# Patient Record
Sex: Female | Born: 1970 | Race: White | Hispanic: No | Marital: Married | Smoking: Former smoker
Health system: Southern US, Community
[De-identification: ages and names within clinical notes are randomized; demographics above are authoritative.]

## PROBLEM LIST (undated history)

## (undated) DIAGNOSIS — M79606 Pain in leg, unspecified: Secondary | ICD-10-CM

## (undated) DIAGNOSIS — M5430 Sciatica, unspecified side: Secondary | ICD-10-CM

## (undated) DIAGNOSIS — I10 Essential (primary) hypertension: Secondary | ICD-10-CM

## (undated) DIAGNOSIS — M545 Low back pain, unspecified: Secondary | ICD-10-CM

## (undated) DIAGNOSIS — B1920 Unspecified viral hepatitis C without hepatic coma: Secondary | ICD-10-CM

## (undated) DIAGNOSIS — G5600 Carpal tunnel syndrome, unspecified upper limb: Secondary | ICD-10-CM

## (undated) DIAGNOSIS — G8929 Other chronic pain: Secondary | ICD-10-CM

## (undated) DIAGNOSIS — N841 Polyp of cervix uteri: Secondary | ICD-10-CM

## (undated) HISTORY — PX: BREAST ENHANCEMENT SURGERY: SHX7

## (undated) HISTORY — PX: TUBAL LIGATION: SHX77

## (undated) HISTORY — PX: HERNIA REPAIR: SHX51

## (undated) HISTORY — DX: Polyp of cervix uteri: N84.1

## (undated) HISTORY — PX: CARPAL TUNNEL RELEASE: SHX101

## (undated) HISTORY — DX: Unspecified viral hepatitis C without hepatic coma: B19.20

## (undated) HISTORY — DX: Carpal tunnel syndrome, unspecified upper limb: G56.00

## (undated) HISTORY — DX: Sciatica, unspecified side: M54.30

---

## 2009-03-15 ENCOUNTER — Emergency Department (HOSPITAL_BASED_OUTPATIENT_CLINIC_OR_DEPARTMENT_OTHER): Admission: EM | Admit: 2009-03-15 | Discharge: 2009-03-15 | Payer: Self-pay | Admitting: Emergency Medicine

## 2009-03-15 ENCOUNTER — Ambulatory Visit: Payer: Self-pay | Admitting: Diagnostic Radiology

## 2009-05-02 ENCOUNTER — Ambulatory Visit: Payer: Self-pay | Admitting: Diagnostic Radiology

## 2009-05-02 ENCOUNTER — Emergency Department (HOSPITAL_BASED_OUTPATIENT_CLINIC_OR_DEPARTMENT_OTHER): Admission: EM | Admit: 2009-05-02 | Discharge: 2009-05-02 | Payer: Self-pay | Admitting: Emergency Medicine

## 2009-07-09 ENCOUNTER — Emergency Department (HOSPITAL_BASED_OUTPATIENT_CLINIC_OR_DEPARTMENT_OTHER): Admission: EM | Admit: 2009-07-09 | Discharge: 2009-07-09 | Payer: Self-pay | Admitting: Emergency Medicine

## 2009-12-12 ENCOUNTER — Emergency Department (HOSPITAL_BASED_OUTPATIENT_CLINIC_OR_DEPARTMENT_OTHER): Admission: EM | Admit: 2009-12-12 | Discharge: 2009-12-12 | Payer: Self-pay | Admitting: Emergency Medicine

## 2009-12-12 ENCOUNTER — Ambulatory Visit: Payer: Self-pay | Admitting: Interventional Radiology

## 2010-02-10 ENCOUNTER — Emergency Department (HOSPITAL_BASED_OUTPATIENT_CLINIC_OR_DEPARTMENT_OTHER)
Admission: EM | Admit: 2010-02-10 | Discharge: 2010-02-10 | Payer: Self-pay | Source: Home / Self Care | Admitting: Emergency Medicine

## 2010-02-17 LAB — URINALYSIS, ROUTINE W REFLEX MICROSCOPIC
Bilirubin Urine: NEGATIVE
Hgb urine dipstick: NEGATIVE
Ketones, ur: NEGATIVE mg/dL
Nitrite: NEGATIVE
Protein, ur: NEGATIVE mg/dL
Specific Gravity, Urine: 1.01 (ref 1.005–1.030)
Urine Glucose, Fasting: NEGATIVE mg/dL
Urobilinogen, UA: 0.2 mg/dL (ref 0.0–1.0)
pH: 7 (ref 5.0–8.0)

## 2010-02-17 LAB — PREGNANCY, URINE: Preg Test, Ur: NEGATIVE

## 2010-04-15 LAB — COMPREHENSIVE METABOLIC PANEL
ALT: 93 U/L — ABNORMAL HIGH (ref 0–35)
AST: 85 U/L — ABNORMAL HIGH (ref 0–37)
Albumin: 4.4 g/dL (ref 3.5–5.2)
Alkaline Phosphatase: 55 U/L (ref 39–117)
BUN: 10 mg/dL (ref 6–23)
CO2: 26 mEq/L (ref 19–32)
Calcium: 10.1 mg/dL (ref 8.4–10.5)
Chloride: 106 mEq/L (ref 96–112)
Creatinine, Ser: 0.8 mg/dL (ref 0.4–1.2)
GFR calc Af Amer: >60 mL/min (ref >60)
GFR calc non Af Amer: >60 mL/min (ref >60)
Glucose, Bld: 89 mg/dL (ref 70–99)
Potassium: 4.2 mEq/L (ref 3.5–5.1)
Sodium: 144 mEq/L (ref 135–145)
Total Bilirubin: 0.8 mg/dL (ref 0.3–1.2)
Total Protein: 7.7 g/dL (ref 6.0–8.3)

## 2010-04-15 LAB — WET PREP, GENITAL
Trich, Wet Prep: NONE SEEN
WBC, Wet Prep HPF POC: NONE SEEN
Yeast Wet Prep HPF POC: NONE SEEN

## 2010-04-15 LAB — DIFFERENTIAL
Basophils Absolute: 0.1 10*3/uL (ref 0.0–0.1)
Basophils Relative: 1 % (ref 0–1)
Eosinophils Absolute: 0.4 10*3/uL (ref 0.0–0.7)
Eosinophils Relative: 6 % — ABNORMAL HIGH (ref 0–5)
Lymphocytes Relative: 29 % (ref 12–46)
Lymphs Abs: 2.2 10*3/uL (ref 0.7–4.0)
Monocytes Absolute: 0.8 10*3/uL (ref 0.1–1.0)
Monocytes Relative: 11 % (ref 3–12)
Neutro Abs: 4.1 10*3/uL (ref 1.7–7.7)
Neutrophils Relative %: 54 % (ref 43–77)

## 2010-04-15 LAB — URINALYSIS, ROUTINE W REFLEX MICROSCOPIC
Glucose, UA: NEGATIVE mg/dL
Hgb urine dipstick: NEGATIVE
Ketones, ur: 15 mg/dL — AB
Nitrite: NEGATIVE
Protein, ur: NEGATIVE mg/dL
Specific Gravity, Urine: 1.03 (ref 1.005–1.030)
Urobilinogen, UA: 0.2 mg/dL (ref 0.0–1.0)
pH: 6 (ref 5.0–8.0)

## 2010-04-15 LAB — CBC
HCT: 44.3 % (ref 36.0–46.0)
Hemoglobin: 15.5 g/dL — ABNORMAL HIGH (ref 12.0–15.0)
MCH: 35 pg — ABNORMAL HIGH (ref 26.0–34.0)
MCHC: 35.1 g/dL (ref 30.0–36.0)
MCV: 99.8 fL (ref 78.0–100.0)
Platelets: 278 10*3/uL (ref 150–400)
RBC: 4.44 MIL/uL (ref 3.87–5.11)
RDW: 12.3 % (ref 11.5–15.5)
WBC: 7.6 10*3/uL (ref 4.0–10.5)

## 2010-04-15 LAB — GC/CHLAMYDIA PROBE AMP, GENITAL
Chlamydia, DNA Probe: NEGATIVE
GC Probe Amp, Genital: NEGATIVE

## 2010-04-15 LAB — PREGNANCY, URINE: Preg Test, Ur: NEGATIVE

## 2010-04-15 LAB — LIPASE, BLOOD: Lipase: 62 U/L (ref 23–300)

## 2010-04-24 LAB — URINALYSIS, ROUTINE W REFLEX MICROSCOPIC
Bilirubin Urine: NEGATIVE
Glucose, UA: NEGATIVE mg/dL
Hgb urine dipstick: NEGATIVE
Ketones, ur: NEGATIVE mg/dL
Nitrite: NEGATIVE
Protein, ur: NEGATIVE mg/dL
Specific Gravity, Urine: 1.005 (ref 1.005–1.030)
Urobilinogen, UA: 0.2 mg/dL (ref 0.0–1.0)
pH: 7 (ref 5.0–8.0)

## 2010-04-24 LAB — PREGNANCY, URINE: Preg Test, Ur: NEGATIVE

## 2010-10-18 ENCOUNTER — Emergency Department (INDEPENDENT_AMBULATORY_CARE_PROVIDER_SITE_OTHER): Payer: Self-pay

## 2010-10-18 ENCOUNTER — Encounter: Payer: Self-pay | Admitting: *Deleted

## 2010-10-18 ENCOUNTER — Inpatient Hospital Stay (HOSPITAL_COMMUNITY)
Admission: AD | Admit: 2010-10-18 | Discharge: 2010-10-20 | DRG: 392 | Disposition: A | Discharge status: Home / Self Care | Payer: Self-pay | Source: Other Acute Inpatient Hospital | Attending: Internal Medicine | Admitting: Internal Medicine

## 2010-10-18 ENCOUNTER — Emergency Department (HOSPITAL_BASED_OUTPATIENT_CLINIC_OR_DEPARTMENT_OTHER)
Admission: EM | Admit: 2010-10-18 | Discharge: 2010-10-18 | Disposition: A | Discharge status: Other Acute Inpatient Hospital | Payer: Self-pay | Attending: Emergency Medicine | Admitting: Emergency Medicine

## 2010-10-18 ENCOUNTER — Other Ambulatory Visit: Payer: Self-pay

## 2010-10-18 DIAGNOSIS — G5600 Carpal tunnel syndrome, unspecified upper limb: Secondary | ICD-10-CM | POA: Diagnosis present

## 2010-10-18 DIAGNOSIS — R0789 Other chest pain: Secondary | ICD-10-CM

## 2010-10-18 DIAGNOSIS — IMO0002 Reserved for concepts with insufficient information to code with codable children: Secondary | ICD-10-CM | POA: Diagnosis present

## 2010-10-18 DIAGNOSIS — K219 Gastro-esophageal reflux disease without esophagitis: Principal | ICD-10-CM | POA: Diagnosis present

## 2010-10-18 DIAGNOSIS — M5430 Sciatica, unspecified side: Secondary | ICD-10-CM | POA: Diagnosis present

## 2010-10-18 DIAGNOSIS — Z7982 Long term (current) use of aspirin: Secondary | ICD-10-CM

## 2010-10-18 DIAGNOSIS — F17200 Nicotine dependence, unspecified, uncomplicated: Secondary | ICD-10-CM | POA: Diagnosis present

## 2010-10-18 DIAGNOSIS — R7401 Elevation of levels of liver transaminase levels: Secondary | ICD-10-CM | POA: Diagnosis present

## 2010-10-18 DIAGNOSIS — R079 Chest pain, unspecified: Secondary | ICD-10-CM | POA: Insufficient documentation

## 2010-10-18 DIAGNOSIS — R7402 Elevation of levels of lactic acid dehydrogenase (LDH): Secondary | ICD-10-CM | POA: Diagnosis present

## 2010-10-18 LAB — COMPREHENSIVE METABOLIC PANEL
ALT: 73 U/L — ABNORMAL HIGH (ref 0–35)
AST: 50 U/L — ABNORMAL HIGH (ref 0–37)
Albumin: 4.2 g/dL (ref 3.5–5.2)
Alkaline Phosphatase: 33 U/L — ABNORMAL LOW (ref 39–117)
BUN: 11 mg/dL (ref 6–23)
CO2: 26 mEq/L (ref 19–32)
Calcium: 10.2 mg/dL (ref 8.4–10.5)
Chloride: 101 mEq/L (ref 96–112)
Creatinine, Ser: 0.5 mg/dL (ref 0.50–1.10)
GFR calc Af Amer: >60 mL/min (ref >60)
GFR calc non Af Amer: >60 mL/min (ref >60)
Glucose, Bld: 94 mg/dL (ref 70–99)
Potassium: 3.9 mEq/L (ref 3.5–5.1)
Sodium: 138 mEq/L (ref 135–145)
Total Bilirubin: 0.3 mg/dL (ref 0.3–1.2)
Total Protein: 7.3 g/dL (ref 6.0–8.3)

## 2010-10-18 LAB — URINALYSIS, ROUTINE W REFLEX MICROSCOPIC
Bilirubin Urine: NEGATIVE
Glucose, UA: NEGATIVE mg/dL
Hgb urine dipstick: NEGATIVE
Ketones, ur: 15 mg/dL — AB
Leukocytes, UA: NEGATIVE
Nitrite: NEGATIVE
Protein, ur: NEGATIVE mg/dL
Specific Gravity, Urine: 1.012 (ref 1.005–1.030)
Urobilinogen, UA: 0.2 mg/dL (ref 0.0–1.0)
pH: 6.5 (ref 5.0–8.0)

## 2010-10-18 LAB — D-DIMER, QUANTITATIVE: D-Dimer, Quant: <0.22 ug/mL-FEU (ref 0.00–0.48)

## 2010-10-18 LAB — LIPASE, BLOOD: Lipase: 32 U/L (ref 11–59)

## 2010-10-18 LAB — CBC
HCT: 40 % (ref 36.0–46.0)
Hemoglobin: 14 g/dL (ref 12.0–15.0)
MCH: 33.2 pg (ref 26.0–34.0)
MCHC: 35 g/dL (ref 30.0–36.0)
MCV: 94.8 fL (ref 78.0–100.0)
Platelets: 240 10*3/uL (ref 150–400)
RBC: 4.22 MIL/uL (ref 3.87–5.11)
RDW: 11.5 % (ref 11.5–15.5)
WBC: 7.8 10*3/uL (ref 4.0–10.5)

## 2010-10-18 LAB — CARDIAC PANEL(CRET KIN+CKTOT+MB+TROPI)
CK, MB: 3.2 ng/mL (ref 0.3–4.0)
Relative Index: INVALID (ref 0.0–2.5)
Total CK: 92 U/L (ref 7–177)
Troponin I: <0.3 ng/mL (ref <0.30)

## 2010-10-18 LAB — PREGNANCY, URINE: Preg Test, Ur: NEGATIVE

## 2010-10-18 MED ORDER — HYDROCODONE-ACETAMINOPHEN 5-325 MG PO TABS
ORAL_TABLET | ORAL | Status: AC
  Administered 2010-10-18: 2 via ORAL
  Filled 2010-10-18: qty 2

## 2010-10-18 MED ORDER — ONDANSETRON HCL 4 MG/2ML IJ SOLN
4.0000 mg | Freq: Once | INTRAMUSCULAR | Status: AC
  Administered 2010-10-18: 4 mg via INTRAVENOUS
  Filled 2010-10-18: qty 2

## 2010-10-18 MED ORDER — HYDROCODONE-ACETAMINOPHEN 5-325 MG PO TABS
2.0000 | ORAL_TABLET | Freq: Once | ORAL | Status: AC
  Administered 2010-10-18: 2 via ORAL

## 2010-10-18 MED ORDER — HYDROMORPHONE HCL 1 MG/ML IJ SOLN
1.0000 mg | Freq: Once | INTRAMUSCULAR | Status: AC
  Administered 2010-10-18: 1 mg via INTRAVENOUS
  Filled 2010-10-18: qty 1

## 2010-10-18 MED ORDER — ASPIRIN EC 325 MG PO TBEC
325.0000 mg | DELAYED_RELEASE_TABLET | Freq: Once | ORAL | Status: AC
  Administered 2010-10-18: 325 mg via ORAL
  Filled 2010-10-18: qty 1

## 2010-10-18 NOTE — ED Notes (Signed)
Patient c/o mid chest pain since this morning, hurts when she takes a breath, some nausea, r Arm/leg pain numbness over the past week, took ibuprofen no relief

## 2010-10-18 NOTE — ED Provider Notes (Signed)
History     CSN: [id number] Arrival date & time: 10/18/2010 11:39 AM   Chief Complaint  Patient presents with  . Chest Pain     (Include location/radiation/quality/duration/timing/severity/associated sxs/prior treatment) Patient is a 40 y.o. female presenting with chest pain.  Chest Pain The chest pain began 3 - 5 hours ago. Chest pain occurs constantly. The chest pain is unchanged. The pain is associated with breathing and exertion. At its most intense, the pain is at 8/10. The pain is currently at 8/10. The severity of the pain is severe. The quality of the pain is described as aching. The pain radiates to the right arm and right shoulder. Chest pain is worsened by certain positions and deep breathing.  Pertinent negatives for associated symptoms include no lower extremity edema. She tried nothing for the symptoms. Risk factors include no known risk factors.  Her family medical history is significant for CAD in family.  Procedure history is negative for cardiac catheterization and echocardiogram.      History reviewed. No pertinent past medical history.   Past Surgical History  Procedure Date  . Hernia repair   . Tubal ligation   . Breast enhancement surgery     No family history on file.  History  Substance Use Topics  . Smoking status: Never Smoker   . Smokeless tobacco: Not on file  . Alcohol Use: Yes    OB History    Grav Para Term Preterm Abortions TAB SAB Ect Mult Living                  Review of Systems  Cardiovascular: Positive for chest pain and leg swelling.  Musculoskeletal: Positive for arthralgias.  All other systems reviewed and are negative.    Allergies  Darvocet; Penicillins; and Sulfa antibiotics  Home Medications   Current Outpatient Rx  Name Route Sig Dispense Refill  . IBUPROFEN PO Oral Take by mouth.      . MULTIVITAMINS PO CAPS Oral Take 1 capsule by mouth daily.        Physical Exam    BP 134/88  Pulse 56  Temp(Src) 98.5  F (36.9 C) (Oral)  Resp 18  SpO2 98%  Physical Exam  Nursing note and vitals reviewed. Constitutional: She is oriented to person, place, and time. She appears well-developed and well-nourished.  HENT:  Head: Normocephalic and atraumatic.  Eyes: Conjunctivae and EOM are normal. Pupils are equal, round, and reactive to light.  Neck: Normal range of motion. Neck supple.  Cardiovascular: Normal rate.   Pulmonary/Chest: Effort normal.  Abdominal: Soft.  Musculoskeletal: Normal range of motion.  Neurological: She is alert and oriented to person, place, and time. She has normal reflexes.  Skin: Skin is warm and dry.  Psychiatric: She has a normal mood and affect.  Pt complains of pain in right arm and right leg for several weeks.  Pt reports pain in leg shoots down from back into leg.  Pt complains of pain in arm with moving.  ED Course  Procedures  Results for orders placed during the hospital encounter of 10/18/10  CBC      Component Value Range   WBC 7.8  4.0 - 10.5 (K/uL)   RBC 4.22  3.87 - 5.11 (MIL/uL)   Hemoglobin 14.0  12.0 - 15.0 (g/dL)   HCT 82.9  56.2 - 13.0 (%)   MCV 94.8  78.0 - 100.0 (fL)   MCH 33.2  26.0 - 34.0 (pg)   MCHC 35.0  30.0 - 36.0 (g/dL)   RDW 16.1  09.6 - 04.5 (%)   Platelets 240  150 - 400 (K/uL)  COMPREHENSIVE METABOLIC PANEL      Component Value Range   Sodium 138  135 - 145 (mEq/L)   Potassium 3.9  3.5 - 5.1 (mEq/L)   Chloride 101  96 - 112 (mEq/L)   CO2 26  19 - 32 (mEq/L)   Glucose, Bld 94  70 - 99 (mg/dL)   BUN 11  6 - 23 (mg/dL)   Creatinine, Ser 4.09  0.50 - 1.10 (mg/dL)   Calcium 81.1  8.4 - 10.5 (mg/dL)   Total Protein 7.3  6.0 - 8.3 (g/dL)   Albumin 4.2  3.5 - 5.2 (g/dL)   AST 50 (*) 0 - 37 (U/L)   ALT 73 (*) 0 - 35 (U/L)   Alkaline Phosphatase 33 (*) 39 - 117 (U/L)   Total Bilirubin 0.3  0.3 - 1.2 (mg/dL)   GFR calc non Af Amer >60  >60 (mL/min)   GFR calc Af Amer >60  >60 (mL/min)  URINALYSIS, ROUTINE W REFLEX MICROSCOPIC       Component Value Range   Color, Urine YELLOW  YELLOW    Appearance CLEAR  CLEAR    Specific Gravity, Urine 1.012  1.005 - 1.030    pH 6.5  5.0 - 8.0    Glucose, UA NEGATIVE  NEGATIVE (mg/dL)   Hgb urine dipstick NEGATIVE  NEGATIVE    Bilirubin Urine NEGATIVE  NEGATIVE    Ketones, ur 15 (*) NEGATIVE (mg/dL)   Protein, ur NEGATIVE  NEGATIVE (mg/dL)   Urobilinogen, UA 0.2  0.0 - 1.0 (mg/dL)   Nitrite NEGATIVE  NEGATIVE    Leukocytes, UA NEGATIVE  NEGATIVE   CARDIAC PANEL(CRET KIN+CKTOT+MB+TROPI)      Component Value Range   Total CK 92  7 - 177 (U/L)   CK, MB 3.2  0.3 - 4.0 (ng/mL)   Troponin I <0.30  <0.30 (ng/mL)   Relative Index RELATIVE INDEX IS INVALID  0.0 - 2.5   PREGNANCY, URINE      Component Value Range   Preg Test, Ur NEGATIVE     No results found.   No diagnosis found.   MDM Results for orders placed during the hospital encounter of 10/18/10  CBC      Component Value Range   WBC 7.8  4.0 - 10.5 (K/uL)   RBC 4.22  3.87 - 5.11 (MIL/uL)   Hemoglobin 14.0  12.0 - 15.0 (g/dL)   HCT 91.4  78.2 - 95.6 (%)   MCV 94.8  78.0 - 100.0 (fL)   MCH 33.2  26.0 - 34.0 (pg)   MCHC 35.0  30.0 - 36.0 (g/dL)   RDW 21.3  08.6 - 57.8 (%)   Platelets 240  150 - 400 (K/uL)  COMPREHENSIVE METABOLIC PANEL      Component Value Range   Sodium 138  135 - 145 (mEq/L)   Potassium 3.9  3.5 - 5.1 (mEq/L)   Chloride 101  96 - 112 (mEq/L)   CO2 26  19 - 32 (mEq/L)   Glucose, Bld 94  70 - 99 (mg/dL)   BUN 11  6 - 23 (mg/dL)   Creatinine, Ser 4.69  0.50 - 1.10 (mg/dL)   Calcium 62.9  8.4 - 10.5 (mg/dL)   Total Protein 7.3  6.0 - 8.3 (g/dL)   Albumin 4.2  3.5 - 5.2 (g/dL)   AST 50 (*) 0 -  37 (U/L)   ALT 73 (*) 0 - 35 (U/L)   Alkaline Phosphatase 33 (*) 39 - 117 (U/L)   Total Bilirubin 0.3  0.3 - 1.2 (mg/dL)   GFR calc non Af Amer >60  >60 (mL/min)   GFR calc Af Amer >60  >60 (mL/min)  URINALYSIS, ROUTINE W REFLEX MICROSCOPIC      Component Value Range   Color, Urine YELLOW  YELLOW     Appearance CLEAR  CLEAR    Specific Gravity, Urine 1.012  1.005 - 1.030    pH 6.5  5.0 - 8.0    Glucose, UA NEGATIVE  NEGATIVE (mg/dL)   Hgb urine dipstick NEGATIVE  NEGATIVE    Bilirubin Urine NEGATIVE  NEGATIVE    Ketones, ur 15 (*) NEGATIVE (mg/dL)   Protein, ur NEGATIVE  NEGATIVE (mg/dL)   Urobilinogen, UA 0.2  0.0 - 1.0 (mg/dL)   Nitrite NEGATIVE  NEGATIVE    Leukocytes, UA NEGATIVE  NEGATIVE   CARDIAC PANEL(CRET KIN+CKTOT+MB+TROPI)      Component Value Range   Total CK 92  7 - 177 (U/L)   CK, MB 3.2  0.3 - 4.0 (ng/mL)   Troponin I <0.30  <0.30 (ng/mL)   Relative Index RELATIVE INDEX IS INVALID  0.0 - 2.5   PREGNANCY, URINE      Component Value Range   Preg Test, Ur NEGATIVE    D-DIMER, QUANTITATIVE      Component Value Range   D-Dimer, Quant <0.22  0.00 - 0.48 (ug/mL-FEU)  LIPASE, BLOOD      Component Value Range   Lipase 32  11 - 59 (U/L)   Dg Chest 2 View  10/18/2010  *RADIOLOGY REPORT*  Clinical Data: Mid chest pain  CHEST - 2 VIEW  Comparison: None.  Findings: Lungs are clear. No pleural effusion or pneumothorax.  Cardiomediastinal silhouette is within normal limits.  Visualized osseous structures are within normal limits.  IMPRESSION: Normal chest radiographs.  Original Report Authenticated By: Charline Bills, M.D.     Date: 10/18/2010  Rate: 56  Rhythm: sinus bradycardia  QRS Axis: normal  Intervals: normal  ST/T Wave abnormalities: nonspecific T wave changes  Conduction Disutrbances:none  Narrative Interpretation:   Old EKG Reviewed: none available   Dr. Rosalia Hammers in to see and examine.  Leg pain seems sciatic,  Arm possible carpal tunnel,  Pt given ASa.  Dr. Rosalia Hammers advised admit to ruleout.  I spoke with Dr. Janee Morn  [facility] who will admit.        Langston Masker, [place] 10/18/10 [phone]

## 2010-10-19 ENCOUNTER — Inpatient Hospital Stay (HOSPITAL_COMMUNITY): Payer: Self-pay

## 2010-10-19 LAB — COMPREHENSIVE METABOLIC PANEL
ALT: 60 U/L — ABNORMAL HIGH (ref 0–35)
AST: 32 U/L (ref 0–37)
Alkaline Phosphatase: 29 U/L — ABNORMAL LOW (ref 39–117)
CO2: 27 mEq/L (ref 19–32)
Calcium: 8.9 mg/dL (ref 8.4–10.5)
GFR calc Af Amer: >60 mL/min (ref >60)
GFR calc non Af Amer: >60 mL/min (ref >60)
Glucose, Bld: 131 mg/dL — ABNORMAL HIGH (ref 70–99)
Potassium: 4 mEq/L (ref 3.5–5.1)
Sodium: 141 mEq/L (ref 135–145)

## 2010-10-19 LAB — LIPID PANEL
HDL: 46 mg/dL (ref >39)
LDL Cholesterol: 88 mg/dL (ref 0–99)

## 2010-10-19 LAB — PRO B NATRIURETIC PEPTIDE: Pro B Natriuretic peptide (BNP): 182.7 pg/mL — ABNORMAL HIGH (ref 0–125)

## 2010-10-19 NOTE — ED Provider Notes (Signed)
History/physical exam/procedure(s) were performed by non-physician practitioner and as supervising physician I was immediately available for consultation/collaboration. I have reviewed all notes and am in agreement with care and plan. I saw and evaluated the patient with midlevel  40 y.o. Female with  Bilateral wrist pain, right sided sciatica, and chest pain.  The chest pain was atypical but the patient had an abnormal ekg with anterior t wave inversion without an old one to compare.  Patient was transferred for further cardiac evaluation.  Hilario Quarry, MD 10/19/10 [phone]

## 2010-10-20 LAB — BASIC METABOLIC PANEL
Chloride: 102 mEq/L (ref 96–112)
GFR calc Af Amer: >60 mL/min (ref >60)
GFR calc non Af Amer: >60 mL/min (ref >60)
Potassium: 3.8 mEq/L (ref 3.5–5.1)
Sodium: 135 mEq/L (ref 135–145)

## 2010-10-20 LAB — HEPATITIS PANEL, ACUTE
HCV Ab: REACTIVE — AB
Hep A IgM: NEGATIVE

## 2010-10-21 NOTE — Discharge Summary (Signed)
Kayla Cooper, Kayla Cooper                 ACCOUNT NO.:  [id number]  MEDICAL RECORD NO.:  [id number]  LOCATION:  2036                         FACILITY:  [facility]  PHYSICIAN:  Isidor Holts, M.D.  DATE OF BIRTH:  1970-08-20  DATE OF ADMISSION:  10/18/2010 DATE OF DISCHARGE:  10/20/2010                        DISCHARGE SUMMARY - REFERRING   PRIMARY MD:  Gentry Fitz.  DISCHARGE DIAGNOSES: 1. Chest pain, atypical, likely noncardiac. 2. Probable gastroesophageal reflux disease. 3. Smoking history. 4. Right upper extremity carpal tunnel syndrome. 5. Cervical/radiculopathy, right lower extremity. 6. Transaminitis, query etiology.  DISCHARGE MEDICATIONS: 1. Oxycodone 10 mg p.o. p.r.n q.6 hours. 40 pills have been prescribed. 2. Protonix 40 mg p.o. daily. 3. Multivitamin therapeutic one p.o. daily.  PROCEDURES: 1. Chest x-ray October 18, 2010, this was a normal chest radiograph. 2. Abdominal ultrasound scan October 19, 2010, this was a negative     abdominal ultrasound scan. 3. A 2-D echocardiogram October 19, 2010, this showed normal left     ventricular cavity/systolic function was normal, estimated ejection     fraction 50% to 55%, no regional wall motion abnormalities.  Left     ventricular diastolic parameters were normal.  There was no atrial     septal defect or foramen ovale.  Pulmonary artery peak pressure was     31 mmHg.  CONSULTATIONS:  Telephone discussion with Dr. Doneen Poisson, orthopedic surgeon on 10/20/2010.  ADMISSION HISTORY:  As H and P notes of October 18, 2010, dictated by Dr. Elza Rafter.  However, in brief, this is a 40 year old female, with known history of hernia repair, tubal ligation, and augmentation mammoplasty, presenting with complaints of several weeks of chest discomfort located in the upper epigastrium/lower substernal area, as well as severe throbbing pain of right hand and occasional shooting pain from her buttocks down to her right knee  and leg, of several weeks' duration.  She was admitted for further evaluation, investigation, and management.  CLINICAL COURSE: 1. Chest pain.  This is clearly atypical.  The patient does not have     risk factors for coronary artery disease other than smoking     history.  A 12-lead EKG  was unremarkable for acute ischemic     changes.  The patient was placed on telemetric monitoring and no     arrhythmias were recorded.  Cardiac enzymes were cycled and remained     unelevated.  D-dimer was negative at less than 0.22.  Chest x-ray     was unremarkable.  A 2-D echocardiogram showed preserved LV     function and no regional wall motion abnormalities.  The patient     was placed on proton pump inhibitor on suspicion of GERD, with     resolution of symptoms.  2. Right upper extremity carpal tunnel syndrome.  The patient     presented as described above, with complaints of throbbing pain     in the right hand, sometimes associated with tingling in the fingers in     the distribution of the median nerve.  She also had positive     Tinel's and Phalen's signs.  This constellation of clinical findings     was consistent  with right carpal tunnel syndrome.  She has been     provided a wrist support as well as analgesic medications.     Certainly, the patient requires orthopedic input.  This can be     arranged an outpatient basis.  3. Radiculopathy, right lower extremity.  The patient did also     complain of pain in a radiculopathic distribution down the back of     her right lower extremity, of several weeks' duration.  She had     a positive straight leg raising test, although she does not appear to     be in obvious acute discomfort.  It is felt that this will also be     better addressed by an orthopedic surgeon.  The patient has been     advised accordingly.  4. Smoking history.  The patient has been counseled appropriately and     has assured me that she is determined to quit.  5.  Transaminitis.  The patient had mildly abnormal LFTs.  Initial     clinical findings showed a total bilirubin of 0.3, alkaline     phosphatase 33, AST 50, ALT 73.  This was rechecked on October 19, 2010, and showed a total bilirubin of 0.1, alkaline phosphatase     29, AST 32, ALT 60.  Viral hepatitis profile was done, and this was     reported as hepatitis B surface antigen as well as hepatitis B core     antibody IgM negative, hepatitis C antibody IgM negative, hepatitis     C antibody was reactive.  This will require a confirmatory test to     rule out false positive reaction.  This is currently in process,     and the patient will be contacted with results, when this is known.     This has been communicated to her.  DISPOSITION:  The patient was on October 20, 2010, considered clinically stable for discharge.  There were no new issues.  She was therefore discharged accordingly.  ACTIVITY:  As tolerated.  Recommended to increase activity slowly.  DIET:  No restrictions.  FOLLOW-UP INSTRUCTIONS:  The patient has been strongly recommended to establish a primary MD, for routine care.  I had a telephone conversation with Dr. Doneen Poisson, orthopedic surgeon on call, and he has kindly agreed to have patient see him in his offce, following discharge.     Isidor Holts, M.D.     CO/MEDQ  D:  10/20/2010  T:  10/20/2010  Job:  [id number]  Electronically Signed by Isidor Holts M.D. on 10/21/2010 11:14:21 AM

## 2010-10-22 LAB — HEPATITIS C VRS RNA DETECT BY PCR-QUAL: Hepatitis C Vrs RNA by PCR-Qual: POSITIVE — AB

## 2010-11-08 NOTE — Discharge Summary (Signed)
  [name], Kayla Cooper                 ACCOUNT NO.:  [id number]  MEDICAL RECORD NO.:  [id number]  LOCATION:  2036                         FACILITY:  [facility]  PHYSICIAN:  Isidor Holts, M.D.  DATE OF BIRTH:  [birth year]  DATE OF ADMISSION:  10/18/2010 DATE OF DISCHARGE:  10/20/2010                              DISCHARGE SUMMARY   ADDENDUM TO DISCHARGE SUMMARY:  This patient's hepatitis C, qualitative RNA PCR was positive, confirming the diagnosis of chronic hepatitis C infection.  Of note, the patient's LFTs at the time of hospitalization was as follows:  Total bilirubin 0.1, alkaline phosphatase 29, AST 32, ALT 60.  Total protein 5.8, albumin 3.2.  The patient currently has no symptoms suggestive of liver disease.  However, she has been contacted via telephone on November 06, 2010, i.e., telephone number [phone] (Work telephone number: [phone]) and informed of her diagnosis.  Meanwhile, we have made arrangements to refer her to the [facility].  I did have a discussion with Dr. Bernette Redbird, gastroenterologist, [facility]ang, on November 06, 2010, and although he recommended that the patient has an established primary care physician at [facility], which the care manager, Chanetta Marshall is working on, he has kindly agreed to act as the patient's primary gastroenterologist here in [place], should the need arise.  All this has been communicated to the patient.  She has verbalized understanding.     Isidor Holts, M.D.     CO/MEDQ  D:  11/07/2010  T:  11/07/2010  Job:  [id number]  cc:   Bernette Redbird, M.D. Vanita Panda. Magnus Ivan, M.D.  Electronically Signed by Isidor Holts M.D. on 11/08/2010 07:10:58 PM

## 2010-11-29 NOTE — H&P (Signed)
[name], [name] NO.:  [id number]  MEDICAL RECORD NO.:  [id number]  LOCATION:  2036                         FACILITY:  [facility]  PHYSICIAN:  Freeman Caldron, MD    DATE OF BIRTH:  01/10/1971  DATE OF ADMISSION:  10/18/2010 DATE OF DISCHARGE:                             HISTORY & PHYSICAL   CHIEF COMPLAINT:  Chest pain.  HISTORY OF PRESENT ILLNESS:  [name] is a 40 year old female who is very little on the way of past medical history who presents today primarily for a chief complaint of chest discomfort.  She states she has been having several weeks of discomfort in her chest as well as some pain in her right arm and left leg; however, was the chest discomfort that prompted her presentation today.  She described the chest pain is feeling like a pressure-type sensation, it is located in her lower substernal area as well as her upper epigastrium, it lasts for her a couple of hours today and was pretty intense and thus why she felt like she needed to be evaluated.  This discomfort that she has had does not have any precise relationship to exertion that come along at any time. There is really nothing that makes it better, except that she got in the ER tonight.  She has noticed that today it felt like it was worse with deep inspiration and does appear to be worse with palpation particularly more so in the epigastrium area.  Further review of systems is notable for pretty severe throbbing of her right hand and shooting pain from her buttocks down to her knee and her right leg as well.  This has been ongoing for several weeks now on keeping her up at night leaving her unable to rest.  PAST MEDICAL HISTORY:  Notable for: 1. Hernia repair. 2. Tubal ligation. 3. Breast augmentation surgery.  SOCIAL HISTORY:  She lives in [place] with her husband.  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  She smokes about half pack a day and has done so for the last 13 years.  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  Denies street drugs.  She is married to her husband, [name] who is her next of kin contact in an emergency who can be rechecked [phone].  FAMILY HISTORY:  Mom passed away just recently.  She has congestive heart failure at the age of 35.  It does sound like it was ischemic in origin with history of MI.  Father is 96, otherwise healthy and then she has one brother without any heart issues.  REVIEW OF SYSTEMS:  Twelve-point review of systems conducted negative with exceptions and pertinent as per HPI including a notable for no recent URI symptoms, no close sick contacts.  She denies any food poison and skin lesions.  Did have some mild shortness of breath, dizziness and nausea with the discomfort that happened in her chest earlier today, otherwise remarkable.  PHYSICAL EXAMINATION:  VITAL SIGNS:  Temperature 98.5, pulse 60, blood pressure 134/88, respiratory rate 18, satting 98% on room air. GENERAL:  She is a young Caucasian female in no acute distress, very pleasant to exam. HEENT:  Anicteric.  Pupils are equally round and reactive to light. Extraocular movements intact.  Oropharynx is benign.  Dentition is fair. NECK:  Supple.  No JVP. CARDIOVASCULAR:  Regular S1 and S2 appreciated without murmurs, clicks, or rubs.  Pulses are 2+ radial and 2+ DP bilaterally.  No carotid bruits. LUNGS:  Clear to auscultation. ABDOMEN:  Soft, nontender, and nondistended.  Mild tenderness only with very deep palpation of the epigastrium. EXTREMITIES:  Warm and well perfused.  No clubbing, cyanosis or edema. NEUROLOGIC:  She is alert and oriented x3.  Cranial nerves II through XII are grossly intact.  Strength is 5/5 throughout.  Two-view chest x-ray is normal.  EKG shows sinus brady  at a rate of 50, normal axis, normal intervals.  She does have a T-wave inversion tender throughout the anterior precordial leads with the exception of V6. Other lab work white count 7.8, hemoglobin 14, platelets 240.  Sodium 138, K 3.9, bicarb 26, creatinine 0.5, glucose 94, T bili 0.3, AST and ALT are 15 and 73, alk phos 33, albumin 4.2, calcium is normal.  Lipase is 32, D-dimer is negative.  CK 92, MB 3.2, troponin less than 0.3.  UA is really negative as is serum pregnancy test.  ASSESSMENT:  This is a 40 year old female with no real past medical history who presents with atypical chest pain. 1. Chest pain.  We will go ahead and cycle cardiac markers.  EKG has     nonspecific T-wave inversion.  We will go ahead and obtain an echo     to evaluate for any structural abnormalities as well as to ensure     that there is no pericardial effusion given the pleuritic component     of her discomfort.  She had a less than a millimeter of concave ST     elevation focally in II, III and aVF and there are reciprocal     changes, little much less pronounced on the EKG here at [facility]     at 2100 versus earlier.  At [facility], we will consider other     etiologies as well and obtain a right upper quadrant ultrasound     given the mild LFT bump, which brings this turn. 2. Hepatitis.  I am going to sent off hepatitis A, B, and C serologies     and get her right upper quadrant ultrasound to evaluate echotexture     of her liver. 3. Sciatica, may needs a referral for PT as an outpatient and once     cardiac markers are negative, can start her on some Naproxen.  For     her what appears to be carpal tunnel, I have recommended a right     hand brace, we can start to her some simple hand exercises to help     with that.  If continues, she may need outpatient referral for     release. 4. Fluids, electrolytes, nutrition/gastrointestinal prophylaxis.  No     fluids, follow electrolytes, regular diet,  heart healthy diet and     ambulation for deep venous thrombosis.     Freeman Caldron, MD     LR/MEDQ  D:  10/18/2010  T:  10/18/2010  Job:  [id number]  Electronically Signed by Freeman Caldron  on 11/29/2010 08:01:00 AM

## 2011-02-18 ENCOUNTER — Ambulatory Visit (INDEPENDENT_AMBULATORY_CARE_PROVIDER_SITE_OTHER): Payer: Self-pay | Admitting: Obstetrics & Gynecology

## 2011-02-18 ENCOUNTER — Encounter: Payer: Self-pay | Admitting: Obstetrics & Gynecology

## 2011-02-18 VITALS — BP 123/85 | HR 72 | Temp 98.8°F | Ht 62.0 in | Wt 164.0 lb

## 2011-02-18 DIAGNOSIS — M5430 Sciatica, unspecified side: Secondary | ICD-10-CM

## 2011-02-18 DIAGNOSIS — Z01812 Encounter for preprocedural laboratory examination: Secondary | ICD-10-CM

## 2011-02-18 DIAGNOSIS — G5700 Lesion of sciatic nerve, unspecified lower limb: Secondary | ICD-10-CM

## 2011-02-18 LAB — POCT PREGNANCY, URINE: Preg Test, Ur: NEGATIVE

## 2011-02-18 NOTE — Progress Notes (Signed)
History:  41 y.o. F here today for evaluation of "cervical polyps" noted during her pap smear  In on e of the free cervical screening clinics. She denies any other GYN  Symptoms.  The following portions of the patient's history were reviewed and updated as appropriate: allergies, current medications, past family history, past medical history, past social history, past surgical history and problem list.   Objective:  Physical Exam Blood pressure 123/85, pulse 72, temperature 98.8 F (37.1 C), temperature source Oral, height 5\' 2"  (1.575 m), weight 164 lb (74.39 kg), last menstrual period 01/30/2011. Gen: NAD Pelvic: Normal appearing external genitalia; normal appearing vaginal mucosa and cervix, no lesions noted anywhere.  Normal discharge.  Small uterus, no palpable masses or adnexal tenderness. No abnormal masses.  Assessment & Plan:  Normal cervical examination Patient relieved Follow up for preventative health maintenance or as needed

## 2011-09-07 ENCOUNTER — Ambulatory Visit (INDEPENDENT_AMBULATORY_CARE_PROVIDER_SITE_OTHER): Payer: Self-pay | Admitting: Family Medicine

## 2011-09-07 VITALS — BP 142/98 | HR 84 | Ht 62.0 in | Wt 164.0 lb

## 2011-09-07 DIAGNOSIS — M7918 Myalgia, other site: Secondary | ICD-10-CM

## 2011-09-07 DIAGNOSIS — M79601 Pain in right arm: Secondary | ICD-10-CM

## 2011-09-07 DIAGNOSIS — IMO0001 Reserved for inherently not codable concepts without codable children: Secondary | ICD-10-CM

## 2011-09-07 DIAGNOSIS — B1920 Unspecified viral hepatitis C without hepatic coma: Secondary | ICD-10-CM

## 2011-09-07 DIAGNOSIS — M79609 Pain in unspecified limb: Secondary | ICD-10-CM

## 2011-09-07 MED ORDER — MELOXICAM 15 MG PO TABS: 15.0000 mg | ORAL_TABLET | Freq: Every day | ORAL | Status: DC

## 2011-09-07 NOTE — Patient Instructions (Addendum)
It was nice to meet you, please check on getting the orange card if it is still available I would like for you to schedule an appointment with me for a complete physical   Sciatica with Rehab The sciatic nerve runs from the back down the leg and is responsible for sensation and control of the muscles in the back (posterior) side of the thigh, lower leg, and foot. Sciatica is a condition that is characterized by inflammation of this nerve.  SYMPTOMS   Signs of nerve damage, including numbness and/or weakness along the posterior side of the lower extremity.   Pain in the back of the thigh that may also travel down the leg.   Pain that worsens when sitting for long periods of time.   Occasionally, pain in the back or buttock.  CAUSES  Inflammation of the sciatic nerve is the cause of sciatica. The inflammation is due to something irritating the nerve. Common sources of irritation include:  Sitting for long periods of time.   Direct trauma to the nerve.   Arthritis of the spine.   Herniated or ruptured disk.   Slipping of the vertebrae (spondylolithesis)   Pressure from soft tissues, such as muscles or ligament-like tissue (fascia).  RISK INCREASES WITH:  Sports that place pressure or stress on the spine (football or weightlifting).   Poor strength and flexibility.   Failure to warm-up properly before activity.   Family history of low back pain or disk disorders.   Previous back injury or surgery.   Poor body mechanics, especially when lifting, or poor posture.  PREVENTION   Warm up and stretch properly before activity.   Maintain physical fitness:   Strength, flexibility, and endurance.   Cardiovascular fitness.   Learn and use proper technique, especially with posture and lifting. When possible, have coach correct improper technique.   Avoid activities that place stress on the spine.  PROGNOSIS If treated properly, then sciatica usually resolves within 6 weeks.  However, occasionally surgery is necessary.  RELATED COMPLICATIONS   Permanent nerve damage, including pain, numbness, tingle, or weakness.   Chronic back pain.   Risks of surgery: infection, bleeding, nerve damage, or damage to surrounding tissues.  TREATMENT Treatment initially involves resting from any activities that aggravate your symptoms. The use of ice and medication may help reduce pain and inflammation. The use of strengthening and stretching exercises may help reduce pain with activity. These exercises may be performed at home or with referral to a therapist. A therapist may recommend further treatments, such as transcutaneous electronic nerve stimulation (TENS) or ultrasound. Your caregiver may recommend corticosteroid injections to help reduce inflammation of the sciatic nerve. If symptoms persist despite non-surgical (conservative) treatment, then surgery may be recommended. MEDICATION  If pain medication is necessary, then nonsteroidal anti-inflammatory medications, such as aspirin and ibuprofen, or other minor pain relievers, such as acetaminophen, are often recommended.   Do not take pain medication for 7 days before surgery.   Prescription pain relievers may be given if deemed necessary by your caregiver. Use only as directed and only as much as you need.   Ointments applied to the skin may be helpful.   Corticosteroid injections may be given by your caregiver. These injections should be reserved for the most serious cases, because they may only be given a certain number of times.  HEAT AND COLD  Cold treatment (icing) relieves pain and reduces inflammation. Cold treatment should be applied for 10 to 15 minutes every 2  to 3 hours for inflammation and pain and immediately after any activity that aggravates your symptoms. Use ice packs or massage the area with a piece of ice (ice massage).   Heat treatment may be used prior to performing the stretching and strengthening  activities prescribed by your caregiver, physical therapist, or athletic trainer. Use a heat pack or soak the injury in warm water.  SEEK MEDICAL CARE IF:  Treatment seems to offer no benefit, or the condition worsens.   Any medications produce adverse side effects.  EXERCISES  RANGE OF MOTION (ROM) AND STRETCHING EXERCISES - Sciatica Most people with sciatic will find that their symptoms worsen with either excessive bending forward (flexion) or arching at the low back (extension). The exercises which will help resolve your symptoms will focus on the opposite motion. Your physician, physical therapist or athletic trainer will help you determine which exercises will be most helpful to resolve your low back pain. Do not complete any exercises without first consulting with your clinician. Discontinue any exercises which worsen your symptoms until you speak to your clinician. If you have pain, numbness or tingling which travels down into your buttocks, leg or foot, the goal of the therapy is for these symptoms to move closer to your back and eventually resolve. Occasionally, these leg symptoms will get better, but your low back pain may worsen; this is typically an indication of progress in your rehabilitation. Be certain to be very alert to any changes in your symptoms and the activities in which you participated in the 24 hours prior to the change. Sharing this information with your clinician will allow him/her to most efficiently treat your condition. These exercises may help you when beginning to rehabilitate your injury. Your symptoms may resolve with or without further involvement from your physician, physical therapist or athletic trainer. While completing these exercises, remember:   Restoring tissue flexibility helps normal motion to return to the joints. This allows healthier, less painful movement and activity.   An effective stretch should be held for at least 30 seconds.   A stretch should  never be painful. You should only feel a gentle lengthening or release in the stretched tissue.  FLEXION RANGE OF MOTION AND STRETCHING EXERCISES: STRETCH - Flexion, Single Knee to Chest   Lie on a firm bed or floor with both legs extended in front of you.   Keeping one leg in contact with the floor, bring your opposite knee to your chest. Hold your leg in place by either grabbing behind your thigh or at your knee.   Pull until you feel a gentle stretch in your low back. Hold 15 seconds.   Slowly release your grasp and repeat the exercise with the opposite side.  Repeat 10 times. Complete this exercise 1 times per day.  STRETCH - Flexion, Double Knee to Chest  Lie on a firm bed or floor with both legs extended in front of you.   Keeping one leg in contact with the floor, bring your opposite knee to your chest.   Tense your stomach muscles to support your back and then lift your other knee to your chest. Hold your legs in place by either grabbing behind your thighs or at your knees.   Pull both knees toward your chest until you feel a gentle stretch in your low back. Hold ____15______ seconds.   Tense your stomach muscles and slowly return one leg at a time to the floor.  Repeat ___10_______ times. Complete this exercise  __1________ times per day.  STRETCH - Low Trunk Rotation   Lie on a firm bed or floor. Keeping your legs in front of you, bend your knees so they are both pointed toward the ceiling and your feet are flat on the floor.   Extend your arms out to the side. This will stabilize your upper body by keeping your shoulders in contact with the floor.   Gently and slowly drop both knees together to one side until you feel a gentle stretch in your low back. Hold for ___15_______ seconds.   Tense your stomach muscles to support your low back as you bring your knees back to the starting position. Repeat the exercise to the other side.  Repeat ____10______ times. Complete this  exercise _____1_____ times per day  EXTENSION RANGE OF MOTION AND FLEXIBILITY EXERCISES: STRENGTHENING EXERCISES - Sciatica  These exercises may help you when beginning to rehabilitate your injury. These exercises should be done near your "sweet spot." This is the neutral, low-back arch, somewhere between fully rounded and fully arched, that is your least painful position. When performed in this safe range of motion, these exercises can be used for people who have either a flexion or extension based injury. These exercises may resolve your symptoms with or without further involvement from your physician, physical therapist or athletic trainer. While completing these exercises, remember:   Muscles can gain both the endurance and the strength needed for everyday activities through controlled exercises.   Complete these exercises as instructed by your physician, physical therapist or athletic trainer. Progress with the resistance and repetition exercises only as your caregiver advises.   You may experience muscle soreness or fatigue, but the pain or discomfort you are trying to eliminate should never worsen during these exercises. If this pain does worsen, stop and make certain you are following the directions exactly. If the pain is still present after adjustments, discontinue the exercise until you can discuss the trouble with your clinician.  STRENGTHENING - Deep Abdominals, Pelvic Tilt   Lie on a firm bed or floor. Keeping your legs in front of you, bend your knees so they are both pointed toward the ceiling and your feet are flat on the floor.   Tense your lower abdominal muscles to press your low back into the floor. This motion will rotate your pelvis so that your tail bone is scooping upwards rather than pointing at your feet or into the floor.   With a gentle tension and even breathing, hold this position for 10-15 seconds.  Repeat 10 times. Complete this exercise 1 times per  day. STRENGTHENING - Abdominals and Quadriceps, Straight Leg Raise   Lie on a firm bed or floor with both legs extended in front of you.   Keeping one leg in contact with the floor, bend the other knee so that your foot can rest flat on the floor.   Find your neutral spine, and tense your abdominal muscles to maintain your spinal position throughout the exercise.   Slowly lift your straight leg off the floor about 6 inches for a count of 15, making sure to not hold your breath.   Still keeping your neutral spine, slowly lower your leg all the way to the floor.  Repeat this exercise with each leg 10  times. Complete this exercise 1 times per day. POSTURE AND BODY MECHANICS CONSIDERATIONS - Sciatica Keeping correct posture when sitting, standing or completing your activities will reduce the stress put on different body  tissues, allowing injured tissues a chance to heal and limiting painful experiences. The following are general guidelines for improved posture. Your physician or physical therapist will provide you with any instructions specific to your needs. While reading these guidelines, remember:  The exercises prescribed by your provider will help you have the flexibility and strength to maintain correct postures.   The correct posture provides the optimal environment for your joints to work. All of your joints have less wear and tear when properly supported by a spine with good posture. This means you will experience a healthier, less painful body.   Correct posture must be practiced with all of your activities, especially prolonged sitting and standing. Correct posture is as important when doing repetitive low-stress activities (typing) as it is when doing a single heavy-load activity (lifting).  RESTING POSITIONS Consider which positions are most painful for you when choosing a resting position. If you have pain with flexion-based activities (sitting, bending, stooping, squatting), choose  a position that allows you to rest in a less flexed posture. You would want to avoid curling into a fetal position on your side. If your pain worsens with extension-based activities (prolonged standing, working overhead), avoid resting in an extended position such as sleeping on your stomach. Most people will find more comfort when they rest with their spine in a more neutral position, neither too rounded nor too arched. Lying on a non-sagging bed on your side with a pillow between your knees, or on your back with a pillow under your knees will often provide some relief. Keep in mind, being in any one position for a prolonged period of time, no matter how correct your posture, can still lead to stiffness. PROPER SITTING POSTURE In order to minimize stress and discomfort on your spine, you must sit with correct posture Sitting with good posture should be effortless for a healthy body. Returning to good posture is a gradual process. Many people can work toward this most comfortably by using various supports until they have the flexibility and strength to maintain this posture on their own. When sitting with proper posture, your ears will fall over your shoulders and your shoulders will fall over your hips. You should use the back of the chair to support your upper back. Your low back will be in a neutral position, just slightly arched. You may place a small pillow or folded towel at the base of your low back for support.  When working at a desk, create an environment that supports good, upright posture. Without extra support, muscles fatigue and lead to excessive strain on joints and other tissues. Keep these recommendations in mind: CHAIR:   A chair should be able to slide under your desk when your back makes contact with the back of the chair. This allows you to work closely.   The chair's height should allow your eyes to be level with the upper part of your monitor and your hands to be slightly lower than  your elbows.  BODY POSITION  Your feet should make contact with the floor. If this is not possible, use a foot rest.   Keep your ears over your shoulders. This will reduce stress on your neck and low back.  INCORRECT SITTING POSTURES   If you are feeling tired and unable to assume a healthy sitting posture, do not slouch or slump. This puts excessive strain on your back tissues, causing more damage and pain. Healthier options include:   Using more support, like a  lumbar pillow.   Switching tasks to something that requires you to be upright or walking.   Talking a brief walk.   Lying down to rest in a neutral-spine position.  PROLONGED STANDING WHILE SLIGHTLY LEANING FORWARD  When completing a task that requires you to lean forward while standing in one place for a long time, place either foot up on a stationary 2-4 inch high object to help maintain the best posture. When both feet are on the ground, the low back tends to lose its slight inward curve. If this curve flattens (or becomes too large), then the back and your other joints will experience too much stress, fatigue more quickly and can cause pain.  CORRECT STANDING POSTURES Proper standing posture should be assumed with all daily activities, even if they only take a few moments, like when brushing your teeth. As in sitting, your ears should fall over your shoulders and your shoulders should fall over your hips. You should keep a slight tension in your abdominal muscles to brace your spine. Your tailbone should point down to the ground, not behind your body, resulting in an over-extended swayback posture.  INCORRECT STANDING POSTURES  Common incorrect standing postures include a forward head, locked knees and/or an excessive swayback. WALKING Walk with an upright posture. Your ears, shoulders and hips should all line-up. PROLONGED ACTIVITY IN A FLEXED POSITION When completing a task that requires you to bend forward at your waist or  lean over a low surface, try to find a way to stabilize 3 of 4 of your limbs. You can place a hand or elbow on your thigh or rest a knee on the surface you are reaching across. This will provide you more stability so that your muscles do not fatigue as quickly. By keeping your knees relaxed, or slightly bent, you will also reduce stress across your low back. CORRECT LIFTING TECHNIQUES DO :   Assume a wide stance. This will provide you more stability and the opportunity to get as close as possible to the object which you are lifting.   Tense your abdominals to brace your spine; then bend at the knees and hips. Keeping your back locked in a neutral-spine position, lift using your leg muscles. Lift with your legs, keeping your back straight.   Test the weight of unknown objects before attempting to lift them.   Try to keep your elbows locked down at your sides in order get the best strength from your shoulders when carrying an object.   Always ask for help when lifting heavy or awkward objects.  INCORRECT LIFTING TECHNIQUES DO NOT:   Lock your knees when lifting, even if it is a small object.   Bend and twist. Pivot at your feet or move your feet when needing to change directions.   Assume that you cannot safely pick up a paperclip without proper posture.  Document Released: 01/19/2005 Document Revised: 01/08/2011 Document Reviewed: 05/03/2008 [facility] Patient Information 2012 [place], [place].

## 2011-09-15 DIAGNOSIS — B1920 Unspecified viral hepatitis C without hepatic coma: Secondary | ICD-10-CM | POA: Insufficient documentation

## 2011-09-15 DIAGNOSIS — M7918 Myalgia, other site: Secondary | ICD-10-CM | POA: Insufficient documentation

## 2011-09-15 DIAGNOSIS — M79601 Pain in right arm: Secondary | ICD-10-CM | POA: Insufficient documentation

## 2011-09-15 NOTE — Progress Notes (Signed)
  Subjective:    Patient ID: Kayla Cooper, female    DOB: 1970/08/12, 41 y.o.   MRN: [id number]  HPI  1. Rt arm pain:  Pain with mild numbness in arm around wrist and into hand, told this was carpal tunnel in the past.  Nothing really exacerbates pain, seems to be there most of the time.  Never had nerve conduction.  Pain does affect grip at times.  Denies neck pain or pain in upper arm.    2.  Lt hip pain:  Patient with complaint of hip pain, but more so in buttock and down back of leg.  Has been there for 1 year.  Diagnosed with sciatica in the past, and states she tried neurontin in the past but this did not help.  She states the only thing that helped her in the past was percocet, although she states she does not take tylenol due to her dx of Hep C.  Her stepmom has been giving her occasional percocet to help with pain  She has never been to PT or tried exercises for this problem.  She denies bowel or bladder dysfunction, gait disturbance or imbalance.   3.  Hep C:  Diagnosed during hospitalization in 10/2010.  Asymptomatic and LFT's with only mild elevation at that time.  She was supposed to f/u with [facility] hepatology clinic but never did.  She is unsure how she contracted this, she denies hx of blood txfusion or IV drug use.      Review of Systems Per HPI    Objective:   Physical Exam  Constitutional: She appears well-developed and well-nourished.  HENT:  Head: Normocephalic and atraumatic.  Neck: Neck supple.  Cardiovascular: Normal rate and regular rhythm.   Musculoskeletal:       Back Exam: Inspection: Normal to inspection Motion: FROM SLR seated:   Negative                       SLR lying: Negative  FABER: negatvie Sensory change: none Reflex change: none   Leg strength Quad:  5/ 5   Hamstring: 5 / 5   Hip flexor:  5/ 5   Hip abductors: 4 / 5   Arm:  Negative phalens and reverse phalens.  No thenar or hypothenar wasting.   Grip and hand Strength 5/5             Assessment & Plan:

## 2011-09-15 NOTE — Assessment & Plan Note (Signed)
Consistent with sciatica.  I discussed with her that I do not prescribe narcotic pain medications for this condition.  Failed neurontin in the past. Will have her try meloxicam and exercises/stretches to see if this helps with symptoms.

## 2011-09-15 NOTE — Assessment & Plan Note (Signed)
No insurance at this time, declines lab testing until she can see if she qualifies for orange card.  ADvised to schedule physical, will need referral back to hepatology clinic.

## 2011-09-15 NOTE — Assessment & Plan Note (Signed)
Doesn't seem to bother her a whole lot, possible carpal tunnel.  Unable to recreate symptoms on exam today.

## 2011-10-13 ENCOUNTER — Emergency Department (HOSPITAL_BASED_OUTPATIENT_CLINIC_OR_DEPARTMENT_OTHER): Payer: Self-pay

## 2011-10-13 ENCOUNTER — Encounter (HOSPITAL_BASED_OUTPATIENT_CLINIC_OR_DEPARTMENT_OTHER): Payer: Self-pay

## 2011-10-13 ENCOUNTER — Emergency Department (HOSPITAL_BASED_OUTPATIENT_CLINIC_OR_DEPARTMENT_OTHER)
Admission: EM | Admit: 2011-10-13 | Discharge: 2011-10-14 | Disposition: A | Discharge status: Home / Self Care | Payer: Self-pay | Attending: Emergency Medicine | Admitting: Emergency Medicine

## 2011-10-13 DIAGNOSIS — M549 Dorsalgia, unspecified: Secondary | ICD-10-CM

## 2011-10-13 DIAGNOSIS — M5430 Sciatica, unspecified side: Secondary | ICD-10-CM | POA: Insufficient documentation

## 2011-10-13 DIAGNOSIS — M545 Low back pain, unspecified: Secondary | ICD-10-CM | POA: Insufficient documentation

## 2011-10-13 DIAGNOSIS — Z87891 Personal history of nicotine dependence: Secondary | ICD-10-CM | POA: Insufficient documentation

## 2011-10-13 LAB — URINALYSIS, ROUTINE W REFLEX MICROSCOPIC
Bilirubin Urine: NEGATIVE
Ketones, ur: NEGATIVE mg/dL
Leukocytes, UA: NEGATIVE
Nitrite: NEGATIVE
Protein, ur: NEGATIVE mg/dL
Urobilinogen, UA: 1 mg/dL (ref 0.0–1.0)

## 2011-10-13 LAB — CBC WITH DIFFERENTIAL/PLATELET
Basophils Absolute: 0.1 10*3/uL (ref 0.0–0.1)
Basophils Relative: 1 % (ref 0–1)
Eosinophils Absolute: 0.6 10*3/uL (ref 0.0–0.7)
Eosinophils Relative: 6 % — ABNORMAL HIGH (ref 0–5)
Lymphs Abs: 3.7 10*3/uL (ref 0.7–4.0)
MCH: 32.2 pg (ref 26.0–34.0)
MCHC: 35 g/dL (ref 30.0–36.0)
MCV: 92 fL (ref 78.0–100.0)
Platelets: 272 10*3/uL (ref 150–400)
RDW: 11.8 % (ref 11.5–15.5)

## 2011-10-13 MED ORDER — ONDANSETRON HCL 4 MG/2ML IJ SOLN
4.0000 mg | Freq: Once | INTRAMUSCULAR | Status: AC
  Administered 2011-10-13: 4 mg via INTRAVENOUS
  Filled 2011-10-13: qty 2

## 2011-10-13 MED ORDER — HYDROMORPHONE HCL PF 1 MG/ML IJ SOLN
1.0000 mg | INTRAMUSCULAR | Status: AC | PRN
  Administered 2011-10-13 – 2011-10-14 (×2): 1 mg via INTRAVENOUS
  Filled 2011-10-13 (×2): qty 1

## 2011-10-13 NOTE — ED Notes (Signed)
Pt c/o lower back pain with urinary frequency and difficulty starting stream. Pt took magnesium citrate last night and has had results. Pt states she has abd bloating.

## 2011-10-13 NOTE — ED Provider Notes (Signed)
History     CSN: [id number]  Arrival date & time 10/13/11  2207   First MD Initiated Contact with Patient 10/13/11 2316      Chief Complaint  Patient presents with  . Back Pain    (Consider location/radiation/quality/duration/timing/severity/associated sxs/prior treatment) Patient is a 41 y.o. female presenting with back pain. The history is provided by the patient.  Back Pain  This is a new problem. Episode onset: several days ago. The problem occurs constantly. The problem has been gradually worsening. The pain is present in the lumbar spine. The pain is moderate. The symptoms are aggravated by bending. Associated symptoms include dysuria. Pertinent negatives include no chest pain, no fever, no bowel incontinence, no perianal numbness and no bladder incontinence. She has tried NSAIDs for the symptoms. The treatment provided no relief.  Pt does have sciatica problems but this feels different.  She has had the urinary symptoms which are new.  She also feels bloated.     Past Medical History  Diagnosis Date  . Sciatica   . Carpal tunnel syndrome   . Cervical polyp   . Hepatitis C     Past Surgical History  Procedure Date  . Hernia repair   . Tubal ligation   . Breast enhancement surgery     Family History  Problem Relation Age of Onset  . Heart disease Mother   . COPD Mother   . Cancer Father     History  Substance Use Topics  . Smoking status: Former Smoker -- 0.5 packs/day  . Smokeless tobacco: Not on file  . Alcohol Use: No    OB History    Grav Para Term Preterm Abortions TAB SAB Ect Mult Living                  Review of Systems  Constitutional: Negative for fever.  Respiratory: Negative for cough.   Cardiovascular: Negative for chest pain.  Gastrointestinal: Positive for nausea and constipation. Negative for vomiting, blood in stool, abdominal distention and bowel incontinence.  Genitourinary: Positive for dysuria. Negative for bladder incontinence,  vaginal bleeding and vaginal discharge.  Musculoskeletal: Positive for back pain.  All other systems reviewed and are negative.    Allergies  Darvocet; Penicillins; and Sulfa antibiotics  Home Medications   Current Outpatient Rx  Name Route Sig Dispense Refill  . IBUPROFEN 200 MG PO TABS Oral Take 800 mg by mouth every 6 (six) hours as needed. For pain.    . OXYCODONE-ACETAMINOPHEN 10-325 MG PO TABS Oral Take 1 tablet by mouth every 4 (four) hours as needed. For pain.      BP 156/100  Pulse 76  Temp 98.6 F (37 C) (Oral)  Resp 16  Ht 5\' 2"  (1.575 m)  Wt 165 lb (74.844 kg)  BMI 30.18 kg/m2  SpO2 98%  LMP 09/25/2011  Physical Exam  Nursing note and vitals reviewed. Constitutional: She appears well-developed and well-nourished. No distress.  HENT:  Head: Normocephalic and atraumatic.  Right Ear: External ear normal.  Left Ear: External ear normal.  Eyes: Conjunctivae are normal. Right eye exhibits no discharge. Left eye exhibits no discharge. No scleral icterus.  Neck: Neck supple. No tracheal deviation present.  Cardiovascular: Normal rate, regular rhythm and intact distal pulses.   Pulmonary/Chest: Effort normal and breath sounds normal. No stridor. No respiratory distress. She has no wheezes. She has no rales.  Abdominal: Soft. Bowel sounds are normal. She exhibits no distension. There is no tenderness. There is no  rebound and no guarding.  Musculoskeletal: She exhibits no edema and no tenderness.       Lumbar back: She exhibits decreased range of motion and tenderness. She exhibits no swelling, no edema and no deformity.  Neurological: She is alert. She has normal strength. No sensory deficit. Cranial nerve deficit:  no gross defecits noted. She exhibits normal muscle tone. She displays no seizure activity. Coordination normal.  Skin: Skin is warm and dry. No rash noted.  Psychiatric: She has a normal mood and affect.    ED Course  Procedures (including critical care  time)  Labs Reviewed  CBC WITH DIFFERENTIAL - Abnormal; Notable for the following:    HCT 35.7 (*)     Eosinophils Relative 6 (*)     All other components within normal limits  COMPREHENSIVE METABOLIC PANEL - Abnormal; Notable for the following:    Glucose, Bld 121 (*)     AST 44 (*)     ALT 73 (*)     Total Bilirubin 0.2 (*)     All other components within normal limits  URINALYSIS, ROUTINE W REFLEX MICROSCOPIC  PREGNANCY, URINE   Ct Abdomen Pelvis Wo Contrast  10/13/2011  *RADIOLOGY REPORT*  Clinical Data: Bilateral low back pain.  CT ABDOMEN AND PELVIS WITHOUT CONTRAST  Technique:  Multidetector CT imaging of the abdomen and pelvis was performed following the standard protocol without intravenous contrast.  Comparison: 12/12/2009.  Findings: Normal appearing kidneys, ureters and urinary bladder. No urinary tract calculi or hydronephrosis.  Normal noncontrasted appearance of the uterus, ovaries, liver, spleen, pancreas, gallbladder and adrenal glands.  No gastrointestinal abnormalities or enlarged lymph nodes.  Normal appearing appendix in the right upper pelvis.  Minimal linear scarring at the left lung base, unchanged.  Mild lumbar and lower thoracic spine degenerative changes.  Stable approximately 10% superior endplate compression deformity of the L1 vertebral body with no acute fracture lines. No subluxations.  IMPRESSION: No acute abnormality.   Original Report Authenticated By: Darrol Angel, M.D.      1. Back pain       MDM  No sign of acute neurological or vascular emergency associated with pt's back pain.  May have a component of sciatica.  Safe for outpatient follow up.         Celene Kras, MD 10/14/11 [phone]

## 2011-10-13 NOTE — ED Notes (Signed)
C/o lower back pain, abd bloating, nausea, urinary freq "straining" x 1 week-also reports no BM x 5 days-took mag citatrate-watery diarrhea

## 2011-10-14 LAB — COMPREHENSIVE METABOLIC PANEL
ALT: 73 U/L — ABNORMAL HIGH (ref 0–35)
AST: 44 U/L — ABNORMAL HIGH (ref 0–37)
CO2: 25 mEq/L (ref 19–32)
Calcium: 8.9 mg/dL (ref 8.4–10.5)
GFR calc non Af Amer: >90 mL/min (ref >90)
Potassium: 3.6 mEq/L (ref 3.5–5.1)
Sodium: 137 mEq/L (ref 135–145)

## 2011-10-14 MED ORDER — OXYCODONE-ACETAMINOPHEN 5-325 MG PO TABS: 1.0000 | ORAL_TABLET | Freq: Four times a day (QID) | ORAL | Status: AC | PRN

## 2011-10-14 MED ORDER — CYCLOBENZAPRINE HCL 10 MG PO TABS: 10.0000 mg | ORAL_TABLET | Freq: Two times a day (BID) | ORAL | Status: AC | PRN

## 2011-10-14 NOTE — ED Notes (Signed)
System downtime, see paper chart.

## 2011-11-07 ENCOUNTER — Emergency Department (HOSPITAL_BASED_OUTPATIENT_CLINIC_OR_DEPARTMENT_OTHER): Payer: Self-pay

## 2011-11-07 ENCOUNTER — Encounter (HOSPITAL_BASED_OUTPATIENT_CLINIC_OR_DEPARTMENT_OTHER): Payer: Self-pay | Admitting: *Deleted

## 2011-11-07 ENCOUNTER — Emergency Department (HOSPITAL_BASED_OUTPATIENT_CLINIC_OR_DEPARTMENT_OTHER)
Admission: EM | Admit: 2011-11-07 | Discharge: 2011-11-07 | Disposition: A | Discharge status: Home / Self Care | Payer: Self-pay | Attending: Emergency Medicine | Admitting: Emergency Medicine

## 2011-11-07 DIAGNOSIS — M5432 Sciatica, left side: Secondary | ICD-10-CM

## 2011-11-07 DIAGNOSIS — X58XXXA Exposure to other specified factors, initial encounter: Secondary | ICD-10-CM | POA: Insufficient documentation

## 2011-11-07 DIAGNOSIS — M25559 Pain in unspecified hip: Secondary | ICD-10-CM | POA: Insufficient documentation

## 2011-11-07 DIAGNOSIS — S335XXA Sprain of ligaments of lumbar spine, initial encounter: Secondary | ICD-10-CM | POA: Insufficient documentation

## 2011-11-07 DIAGNOSIS — S39012A Strain of muscle, fascia and tendon of lower back, initial encounter: Secondary | ICD-10-CM

## 2011-11-07 MED ORDER — OXYCODONE-ACETAMINOPHEN 5-325 MG PO TABS
1.0000 | ORAL_TABLET | Freq: Once | ORAL | Status: AC
  Administered 2011-11-07: 1 via ORAL
  Filled 2011-11-07 (×2): qty 1

## 2011-11-07 MED ORDER — IBUPROFEN 800 MG PO TABS
800.0000 mg | ORAL_TABLET | Freq: Once | ORAL | Status: AC
  Administered 2011-11-07: 800 mg via ORAL
  Filled 2011-11-07: qty 1

## 2011-11-07 MED ORDER — OXYCODONE-ACETAMINOPHEN 5-325 MG PO TABS: 1.0000 | ORAL_TABLET | ORAL | Status: DC | PRN

## 2011-11-07 MED ORDER — CYCLOBENZAPRINE HCL 10 MG PO TABS
10.0000 mg | ORAL_TABLET | Freq: Once | ORAL | Status: AC
  Administered 2011-11-07: 10 mg via ORAL
  Filled 2011-11-07: qty 1

## 2011-11-07 NOTE — ED Notes (Signed)
Pt states she was moving furniture Wed and injured her back and left hip

## 2011-11-07 NOTE — ED Provider Notes (Signed)
History   This chart was scribed for Kayla Booze, MD by Toya Smothers. The patient was seen in room [facility]. Patient's care was started at 1435.  CSN: [id number]  Arrival date & time 11/07/11  1435   First MD Initiated Contact with Patient 11/07/11 1622      Chief Complaint  Patient presents with  . Back Injury   HPI  Kayla Cooper is a 41 y.o. female with a h/o Carpal tunnel, Hepatitis C, and Sciatica who presents to the ED complaining of 3 days of new gradual onset severe lower back pain near the left sacral area of the left hip. Pain is sharp, ranked 9/10, unchanged, aggravated in orthopnea position, and alleviated by nothing. She denotes onset after lifting an object from the floor and moving furniture. PTA pt has treated pain with hot compress and stretching providing mild to no relief. Denies numbness, tingling, neck pain, weakness, and gaiting problem.   Past Medical History  Diagnosis Date  . Sciatica   . Carpal tunnel syndrome   . Cervical polyp   . Hepatitis C     Past Surgical History  Procedure Date  . Hernia repair   . Tubal ligation   . Breast enhancement surgery     Family History  Problem Relation Age of Onset  . Heart disease Mother   . COPD Mother   . Cancer Father     History  Substance Use Topics  . Smoking status: Former Smoker -- 0.5 packs/day  . Smokeless tobacco: Not on file  . Alcohol Use: No   Review of Systems  Musculoskeletal: Positive for back pain.  Neurological: Negative for tremors, weakness and numbness.  All other systems reviewed and are negative.    Allergies  Darvocet; Penicillins; and Sulfa antibiotics  Home Medications   Current Outpatient Rx  Name Route Sig Dispense Refill  . IBUPROFEN 200 MG PO TABS Oral Take 800 mg by mouth every 6 (six) hours as needed. For pain.    . OXYCODONE-ACETAMINOPHEN 10-325 MG PO TABS Oral Take 1 tablet by mouth every 4 (four) hours as needed. For pain.      BP 149/86  Pulse 80  Temp  98.2 F (36.8 C) (Oral)  Resp 20  Ht 5\' 3"  (1.6 m)  Wt 165 lb (74.844 kg)  BMI 29.23 kg/m2  SpO2 100%  LMP 10/31/2011  Physical Exam  Nursing note and vitals reviewed. Constitutional: She is oriented to person, place, and time. She appears well-developed and well-nourished.       Appears uncomfortable.  HENT:  Head: Normocephalic and atraumatic.  Mouth/Throat: No oropharyngeal exudate.  Eyes: EOM are normal. Pupils are equal, round, and reactive to light. No scleral icterus.  Neck: Normal range of motion. Neck supple. No tracheal deviation present.  Cardiovascular: Normal rate, regular rhythm and normal heart sounds.   No murmur heard. Pulmonary/Chest: Effort normal and breath sounds normal.  Abdominal: Soft. Bowel sounds are normal. There is no tenderness.  Musculoskeletal:       Moderate tenderness over left sacral joint. Tenderness over eft sacral hip. Voluntary guarding for movement of the left hip. No sensory deficit. Poor cooperation for motor exam.  Lymphadenopathy:    She has no cervical adenopathy.  Neurological: She is alert and oriented to person, place, and time. No cranial nerve deficit. Coordination normal.  Skin: Skin is warm and dry. No rash noted.  Psychiatric: She has a normal mood and affect. Her behavior is normal. Judgment and  thought content normal.    ED Course  Procedures  DIAGNOSTIC STUDIES: Oxygen Saturation is 100% on room air, noraml by my interpretation.    COORDINATION OF CARE: 16:28- Evaluated Pt. Pt is awake, alert, and oriented.   16:36- Ordered DG Lumbar Spine Complete 1 time imaging and DG Hip Complete Left 1 time imaging. 17:30- Rechecked Pt. Pt is now sitting up.  Dg Lumbar Spine Complete  11/07/2011  *RADIOLOGY REPORT*  Clinical Data: Lifting injury.  Back and left hip pain.  LUMBAR SPINE - COMPLETE 4+ VIEW  Comparison: CT 10/13/2011  Findings: There is mild curvature convex to the right.  There is an old superior endplate deformity on  the left at L1 which appears unchanged.  No other evidence of old or acute fracture.  No disc space narrowing with significant facet degeneration.  IMPRESSION: No acute finding.  Old superior endplate deformity on the left at L1.   Original Report Authenticated By: Thomasenia Sales, M.D.    Dg Hip Complete Left  11/07/2011  *RADIOLOGY REPORT*  Clinical Data: Lifting injury.  Left hip pain.  LEFT HIP - COMPLETE 2+ VIEW  Comparison: None.  Findings: No pelvic abnormality.  Sacroiliac joints appear normal. Both hip joints appear normal.  No joint space narrowing osteophyte formation.  No sign of avascular necrosis.  IMPRESSION: Normal radiographs.   Original Report Authenticated By: Thomasenia Sales, M.D.      1. Lumbar strain   2. Left sided sciatica       MDM  Left hip pain which is clearly musculoskeletal. Because of guarding, I cannot get a good exam. X-rays will be obtained to rule out more serious injury. Anticipate sending her home on NSAIDs and Percocet for pain. Initial dose of Percocet given in the emergency department.  X-rays are unremarkable. She is advised to take 4 Aleve tablets a day or 12 ibuprofen tablets a day and is given a prescription for Percocet.    I personally performed the services described in this documentation, which was scribed in my presence. The recorded information has been reviewed and considered.        Kayla Booze, MD 11/07/11 [phone]

## 2012-01-05 DIAGNOSIS — M549 Dorsalgia, unspecified: Secondary | ICD-10-CM | POA: Insufficient documentation

## 2012-01-21 DIAGNOSIS — M25569 Pain in unspecified knee: Secondary | ICD-10-CM | POA: Insufficient documentation

## 2012-12-22 ENCOUNTER — Emergency Department (HOSPITAL_BASED_OUTPATIENT_CLINIC_OR_DEPARTMENT_OTHER): Payer: Medicaid Other

## 2012-12-22 ENCOUNTER — Emergency Department (HOSPITAL_BASED_OUTPATIENT_CLINIC_OR_DEPARTMENT_OTHER)
Admission: EM | Admit: 2012-12-22 | Discharge: 2012-12-22 | Disposition: A | Discharge status: Home / Self Care | Payer: Medicaid Other | Attending: Emergency Medicine | Admitting: Emergency Medicine

## 2012-12-22 ENCOUNTER — Encounter (HOSPITAL_BASED_OUTPATIENT_CLINIC_OR_DEPARTMENT_OTHER): Payer: Self-pay | Admitting: Emergency Medicine

## 2012-12-22 DIAGNOSIS — Z8619 Personal history of other infectious and parasitic diseases: Secondary | ICD-10-CM | POA: Insufficient documentation

## 2012-12-22 DIAGNOSIS — R52 Pain, unspecified: Secondary | ICD-10-CM | POA: Insufficient documentation

## 2012-12-22 DIAGNOSIS — M25569 Pain in unspecified knee: Secondary | ICD-10-CM | POA: Insufficient documentation

## 2012-12-22 DIAGNOSIS — Z8669 Personal history of other diseases of the nervous system and sense organs: Secondary | ICD-10-CM | POA: Insufficient documentation

## 2012-12-22 DIAGNOSIS — Z8739 Personal history of other diseases of the musculoskeletal system and connective tissue: Secondary | ICD-10-CM | POA: Insufficient documentation

## 2012-12-22 DIAGNOSIS — Z8742 Personal history of other diseases of the female genital tract: Secondary | ICD-10-CM | POA: Insufficient documentation

## 2012-12-22 DIAGNOSIS — M25561 Pain in right knee: Secondary | ICD-10-CM

## 2012-12-22 DIAGNOSIS — R609 Edema, unspecified: Secondary | ICD-10-CM | POA: Insufficient documentation

## 2012-12-22 DIAGNOSIS — Z87891 Personal history of nicotine dependence: Secondary | ICD-10-CM | POA: Insufficient documentation

## 2012-12-22 DIAGNOSIS — Z880 Allergy status to penicillin: Secondary | ICD-10-CM | POA: Insufficient documentation

## 2012-12-22 LAB — COMPREHENSIVE METABOLIC PANEL
Albumin: 3.8 g/dL (ref 3.5–5.2)
BUN: 11 mg/dL (ref 6–23)
CO2: 28 mEq/L (ref 19–32)
Chloride: 100 mEq/L (ref 96–112)
Creatinine, Ser: 0.7 mg/dL (ref 0.50–1.10)
GFR calc non Af Amer: >90 mL/min (ref >90)
Total Bilirubin: 0.3 mg/dL (ref 0.3–1.2)

## 2012-12-22 LAB — URINALYSIS, ROUTINE W REFLEX MICROSCOPIC
Leukocytes, UA: NEGATIVE
Protein, ur: NEGATIVE mg/dL
Urobilinogen, UA: 0.2 mg/dL (ref 0.0–1.0)

## 2012-12-22 MED ORDER — ACETAMINOPHEN 650 MG PO TABS: 650.0000 mg | ORAL_TABLET | Freq: Four times a day (QID) | ORAL | Status: DC | PRN

## 2012-12-22 MED ORDER — IBUPROFEN 800 MG PO TABS: 800.0000 mg | ORAL_TABLET | Freq: Three times a day (TID) | ORAL | Status: DC | PRN

## 2012-12-22 NOTE — ED Notes (Signed)
MD at bedside. 

## 2012-12-22 NOTE — ED Notes (Signed)
Updated about wait. Nourishments provided.

## 2012-12-22 NOTE — ED Provider Notes (Signed)
CSN: [id number]     Arrival date & time 12/22/12  1109 History   None    Chief Complaint  Patient presents with  . Knee Pain   (Consider location/radiation/quality/duration/timing/severity/associated sxs/prior Treatment) Patient is a 42 y.o. female presenting with knee pain. The history is provided by the patient.  Knee Pain Location:  Knee Time since incident:  3 months Injury: no   Knee location:  R knee Pain details:    Quality:  Aching, dull and shooting   Radiates to:  Does not radiate   Severity:  Mild   Onset quality:  Gradual   Duration:  3 months   Timing:  Intermittent   Progression:  Waxing and waning Chronicity:  Chronic Dislocation: no   Foreign body present:  No foreign bodies Prior injury to area:  No Relieved by:  Nothing Worsened by:  Nothing tried Ineffective treatments:  None tried Associated symptoms: no back pain, no decreased ROM, no fatigue, no fever, no itching, no muscle weakness, no neck pain, no numbness, no stiffness, no swelling and no tingling     Past Medical History  Diagnosis Date  . Sciatica   . Carpal tunnel syndrome   . Cervical polyp   . Hepatitis C    Past Surgical History  Procedure Laterality Date  . Hernia repair    . Tubal ligation    . Breast enhancement surgery     Family History  Problem Relation Age of Onset  . Heart disease Mother   . COPD Mother   . Cancer Father    History  Substance Use Topics  . Smoking status: Former Smoker -- 0.50 packs/day  . Smokeless tobacco: Not on file  . Alcohol Use: No   OB History   Grav Para Term Preterm Abortions TAB SAB Ect Mult Living                 Review of Systems  Constitutional: Negative for fever and fatigue.  Musculoskeletal: Negative for back pain, neck pain and stiffness.  Skin: Negative for itching.    Allergies  Darvocet; Penicillins; and Sulfa antibiotics  Home Medications   Current Outpatient Rx  Name  Route  Sig  Dispense  Refill  . ibuprofen  (ADVIL,MOTRIN) 200 MG tablet   Oral   Take 800 mg by mouth every 6 (six) hours as needed. For pain.         Marland Kitchen oxyCODONE-acetaminophen (PERCOCET) 10-325 MG per tablet   Oral   Take 1 tablet by mouth every 4 (four) hours as needed. For pain.         Marland Kitchen oxyCODONE-acetaminophen (PERCOCET/ROXICET) 5-325 MG per tablet   Oral   Take 1 tablet by mouth every 4 (four) hours as needed for pain.   20 tablet   0    BP 183/99  Pulse 80  Temp(Src) 98 F (36.7 C) (Oral)  Resp 16  Ht 5\' 3"  (1.6 m)  Wt 191 lb (86.637 kg)  BMI 33.84 kg/m2  SpO2 100%  LMP 12/08/2012 Physical Exam  Constitutional: She appears well-developed and well-nourished. No distress.  Cardiovascular: Normal rate, regular rhythm, normal heart sounds and intact distal pulses.  Exam reveals no gallop and no friction rub.   No murmur heard. Pulmonary/Chest: Effort normal and breath sounds normal. No respiratory distress. She has no wheezes. She has no rales. She exhibits no tenderness.  Musculoskeletal: She exhibits edema.  Muscle 5/5 in LE bil. Mild tenderness to pain in the anterior knee  along the joint line. No posterior knee pain.  Symmetric 2+ pitting edema bil.  Skin: She is not diaphoretic.    ED Course  Procedures (including critical care time) Labs Review Labs Reviewed  COMPREHENSIVE METABOLIC PANEL - Abnormal; Notable for the following:    Glucose, Bld 101 (*)    ALT 51 (*)    All other components within normal limits  URINALYSIS, ROUTINE W REFLEX MICROSCOPIC   Imaging Review No results found.  EKG Interpretation   None       MDM   1. Knee pain, acute, right     1. Knee pain The etiology of the patients knee pain is unclear. No evidence of effusion on exam. Xray negative for actue process of degenerative joint disease. May be a meniscal injury. Plan for patient to f/u with sports medicine. Reccommended patient to use tylenol and NSAIDs as needed for pain. Patient instructed to see MD for new or  worsening symptoms.     Pleas Koch, MD 12/22/12 1237  Pleas Koch, MD 12/22/12 1247

## 2012-12-22 NOTE — ED Notes (Signed)
Pt reports right knee pain for months, but pain has been worse and knee "gives out" the past 2 days.

## 2012-12-31 NOTE — ED Provider Notes (Signed)
I saw and evaluated the patient, reviewed the resident's note and I agree with the findings and plan.   .Face to face Exam:  General:  Awake HEENT:  Atraumatic Resp:  Normal effort Abd:  Nondistended Neuro:No focal weakness  Nelia Shi, MD 12/31/12 [phone]

## 2013-01-30 ENCOUNTER — Encounter (HOSPITAL_BASED_OUTPATIENT_CLINIC_OR_DEPARTMENT_OTHER): Payer: Self-pay | Admitting: Emergency Medicine

## 2013-01-30 ENCOUNTER — Emergency Department (HOSPITAL_BASED_OUTPATIENT_CLINIC_OR_DEPARTMENT_OTHER)
Admission: EM | Admit: 2013-01-30 | Discharge: 2013-01-30 | Disposition: A | Discharge status: Home / Self Care | Payer: Medicaid Other | Attending: Emergency Medicine | Admitting: Emergency Medicine

## 2013-01-30 DIAGNOSIS — Z880 Allergy status to penicillin: Secondary | ICD-10-CM | POA: Insufficient documentation

## 2013-01-30 DIAGNOSIS — R52 Pain, unspecified: Secondary | ICD-10-CM | POA: Insufficient documentation

## 2013-01-30 DIAGNOSIS — Z8739 Personal history of other diseases of the musculoskeletal system and connective tissue: Secondary | ICD-10-CM | POA: Insufficient documentation

## 2013-01-30 DIAGNOSIS — J029 Acute pharyngitis, unspecified: Secondary | ICD-10-CM | POA: Insufficient documentation

## 2013-01-30 DIAGNOSIS — Z8742 Personal history of other diseases of the female genital tract: Secondary | ICD-10-CM | POA: Insufficient documentation

## 2013-01-30 DIAGNOSIS — Z8669 Personal history of other diseases of the nervous system and sense organs: Secondary | ICD-10-CM | POA: Insufficient documentation

## 2013-01-30 DIAGNOSIS — J111 Influenza due to unidentified influenza virus with other respiratory manifestations: Secondary | ICD-10-CM | POA: Insufficient documentation

## 2013-01-30 DIAGNOSIS — Z8619 Personal history of other infectious and parasitic diseases: Secondary | ICD-10-CM | POA: Insufficient documentation

## 2013-01-30 DIAGNOSIS — Z87891 Personal history of nicotine dependence: Secondary | ICD-10-CM | POA: Insufficient documentation

## 2013-01-30 MED ORDER — IBUPROFEN 800 MG PO TABS
800.0000 mg | ORAL_TABLET | Freq: Once | ORAL | Status: AC
  Administered 2013-01-30: 800 mg via ORAL
  Filled 2013-01-30: qty 1

## 2013-01-30 MED ORDER — HYDROCOD POLST-CHLORPHEN POLST 10-8 MG/5ML PO LQCR: 5.0000 mL | Freq: Two times a day (BID) | ORAL | Status: DC | PRN

## 2013-01-30 MED ORDER — IBUPROFEN 800 MG PO TABS: 800.0000 mg | ORAL_TABLET | Freq: Three times a day (TID) | ORAL | Status: DC

## 2013-01-30 MED ORDER — ALBUTEROL SULFATE HFA 108 (90 BASE) MCG/ACT IN AERS: 2.0000 | INHALATION_SPRAY | RESPIRATORY_TRACT | Status: DC | PRN

## 2013-01-30 MED ORDER — OSELTAMIVIR PHOSPHATE 75 MG PO CAPS: 75.0000 mg | ORAL_CAPSULE | Freq: Two times a day (BID) | ORAL | Status: DC

## 2013-01-30 NOTE — ED Notes (Signed)
C/o sore throat, prod cough, body aches since 7pm yesterday-NAD

## 2013-01-30 NOTE — ED Provider Notes (Signed)
CSN: [id number]     Arrival date & time 01/30/13  1356 History   First MD Initiated Contact with Patient 01/30/13 1439     Chief Complaint  Patient presents with  . URI   (Consider location/radiation/quality/duration/timing/severity/associated sxs/prior Treatment) HPI Comments: Patient reports sudden onset of flulike symptoms at 7 PM yesterday. She has sinus congestion, sore throat and cough productive of green sputum. Patient reports fever, chills and generalized body aches with malaise. No nausea, vomiting or diarrhea.  Patient is a 42 y.o. female presenting with URI.  URI Presenting symptoms: congestion, cough, fever and sore throat     Past Medical History  Diagnosis Date  . Sciatica   . Carpal tunnel syndrome   . Cervical polyp   . Hepatitis C    Past Surgical History  Procedure Laterality Date  . Hernia repair    . Tubal ligation    . Breast enhancement surgery    . Carpal tunnel release     Family History  Problem Relation Age of Onset  . Heart disease Mother   . COPD Mother   . Cancer Father    History  Substance Use Topics  . Smoking status: Former Smoker -- 0.50 packs/day  . Smokeless tobacco: Not on file  . Alcohol Use: No   OB History   Grav Para Term Preterm Abortions TAB SAB Ect Mult Living                 Review of Systems  Constitutional: Positive for fever and chills.  HENT: Positive for congestion and sore throat.   Respiratory: Positive for cough.   All other systems reviewed and are negative.    Allergies  Darvocet; Hydrocodone; Penicillins; and Sulfa antibiotics  Home Medications   Current Outpatient Rx  Name  Route  Sig  Dispense  Refill  . cyclobenzaprine (FLEXERIL) 10 MG tablet   Oral   Take 10 mg by mouth 3 (three) times daily as needed for muscle spasms.         Marland Kitchen oxyCODONE (ROXICODONE) 15 MG immediate release tablet   Oral   Take 15 mg by mouth every 4 (four) hours as needed for pain.         . Promethazine HCl  (PHENERGAN PO)   Oral   Take by mouth.         . Acetaminophen 650 MG TABS   Oral   Take 1 tablet (650 mg total) by mouth 4 (four) times daily as needed.   40 tablet   0   . ibuprofen (ADVIL,MOTRIN) 800 MG tablet   Oral   Take 1 tablet (800 mg total) by mouth every 8 (eight) hours as needed.   30 tablet   0   . oxyCODONE-acetaminophen (PERCOCET) 10-325 MG per tablet   Oral   Take 1 tablet by mouth every 4 (four) hours as needed. For pain.         Marland Kitchen oxyCODONE-acetaminophen (PERCOCET/ROXICET) 5-325 MG per tablet   Oral   Take 1 tablet by mouth every 4 (four) hours as needed for pain.   20 tablet   0    BP 164/103  Pulse 111  Temp(Src) 101.4 F (38.6 C) (Oral)  Resp 16  Ht 5\' 2"  (1.575 m)  Wt 190 lb (86.183 kg)  BMI 34.74 kg/m2  SpO2 98%  LMP 12/15/2012 Physical Exam  Constitutional: She is oriented to person, place, and time. She appears well-developed and well-nourished. No distress.  HENT:  Head: Normocephalic and atraumatic.  Right Ear: Hearing normal.  Left Ear: Hearing normal.  Nose: Nose normal.  Mouth/Throat: Oropharynx is clear and moist and mucous membranes are normal.  Eyes: Conjunctivae and EOM are normal. Pupils are equal, round, and reactive to light.  Neck: Normal range of motion. Neck supple.  Cardiovascular: Regular rhythm, S1 normal and S2 normal.  Exam reveals no gallop and no friction rub.   No murmur heard. Pulmonary/Chest: Effort normal and breath sounds normal. No respiratory distress. She exhibits no tenderness.  Abdominal: Soft. Normal appearance and bowel sounds are normal. There is no hepatosplenomegaly. There is no tenderness. There is no rebound, no guarding, no tenderness at McBurney's point and negative Murphy's sign. No hernia.  Musculoskeletal: Normal range of motion.  Neurological: She is alert and oriented to person, place, and time. She has normal strength. No cranial nerve deficit or sensory deficit. Coordination normal. GCS  eye subscore is 4. GCS verbal subscore is 5. GCS motor subscore is 6.  Skin: Skin is warm, dry and intact. No rash noted. No cyanosis.  Psychiatric: She has a normal mood and affect. Her speech is normal and behavior is normal. Thought content normal.    ED Course  Procedures (including critical care time) Labs Review Labs Reviewed - No data to display Imaging Review No results found.  EKG Interpretation   None       MDM  Diagnosis: Flu syndrome  Patient presents with sudden onset of fever, chills and upper respiratory infection symptoms. She has normal oxygenation and lungs are clear. No wheezing and no clinical concern for pneumonia. Remainder of the examination is unremarkable other than the patient's fever on arrival. Continue to treat him with Motrin and/or Tylenol. Symptomatic treatment with Vicodin and albuterol. Will treat empirically with Tamiflu.    Gilda Crease, MD 01/30/13 1459

## 2013-11-13 ENCOUNTER — Encounter (HOSPITAL_BASED_OUTPATIENT_CLINIC_OR_DEPARTMENT_OTHER): Payer: Self-pay | Admitting: Emergency Medicine

## 2013-11-13 ENCOUNTER — Emergency Department (HOSPITAL_BASED_OUTPATIENT_CLINIC_OR_DEPARTMENT_OTHER)
Admission: EM | Admit: 2013-11-13 | Discharge: 2013-11-13 | Disposition: A | Discharge status: Home / Self Care | Payer: Medicaid Other | Attending: Emergency Medicine | Admitting: Emergency Medicine

## 2013-11-13 DIAGNOSIS — Z8669 Personal history of other diseases of the nervous system and sense organs: Secondary | ICD-10-CM | POA: Insufficient documentation

## 2013-11-13 DIAGNOSIS — J0100 Acute maxillary sinusitis, unspecified: Secondary | ICD-10-CM | POA: Insufficient documentation

## 2013-11-13 DIAGNOSIS — Z880 Allergy status to penicillin: Secondary | ICD-10-CM | POA: Insufficient documentation

## 2013-11-13 DIAGNOSIS — Z79899 Other long term (current) drug therapy: Secondary | ICD-10-CM | POA: Insufficient documentation

## 2013-11-13 DIAGNOSIS — Z8619 Personal history of other infectious and parasitic diseases: Secondary | ICD-10-CM | POA: Insufficient documentation

## 2013-11-13 DIAGNOSIS — Z8739 Personal history of other diseases of the musculoskeletal system and connective tissue: Secondary | ICD-10-CM | POA: Insufficient documentation

## 2013-11-13 DIAGNOSIS — Z87448 Personal history of other diseases of urinary system: Secondary | ICD-10-CM | POA: Insufficient documentation

## 2013-11-13 DIAGNOSIS — J069 Acute upper respiratory infection, unspecified: Secondary | ICD-10-CM | POA: Insufficient documentation

## 2013-11-13 DIAGNOSIS — Z87891 Personal history of nicotine dependence: Secondary | ICD-10-CM | POA: Insufficient documentation

## 2013-11-13 DIAGNOSIS — Z791 Long term (current) use of non-steroidal anti-inflammatories (NSAID): Secondary | ICD-10-CM | POA: Insufficient documentation

## 2013-11-13 MED ORDER — FLUTICASONE PROPIONATE 50 MCG/ACT NA SUSP: 2.0000 | Freq: Every day | NASAL | Status: DC

## 2013-11-13 MED ORDER — AZITHROMYCIN 250 MG PO TABS: 250.0000 mg | ORAL_TABLET | Freq: Every day | ORAL | Status: DC

## 2013-11-13 NOTE — ED Notes (Signed)
Ear pressure, facial pain. Feels like she has sinusitis.

## 2013-11-13 NOTE — Discharge Instructions (Signed)
Take antibiotic to completion. Use nasal spray as directed. Rest and stay well-hydrated. You may also rinse nasal saline.  Sinusitis Sinusitis is redness, soreness, and inflammation of the paranasal sinuses. Paranasal sinuses are air pockets within the bones of your face (beneath the eyes, the middle of the forehead, or above the eyes). In healthy paranasal sinuses, mucus is able to drain out, and air is able to circulate through them by way of your nose. However, when your paranasal sinuses are inflamed, mucus and air can become trapped. This can allow bacteria and other germs to grow and cause infection. Sinusitis can develop quickly and last only a short time (acute) or continue over a long period (chronic). Sinusitis that lasts for more than 12 weeks is considered chronic.  CAUSES  Causes of sinusitis include:  Allergies.  Structural abnormalities, such as displacement of the cartilage that separates your nostrils (deviated septum), which can decrease the air flow through your nose and sinuses and affect sinus drainage.  Functional abnormalities, such as when the small hairs (cilia) that line your sinuses and help remove mucus do not work properly or are not present. SIGNS AND SYMPTOMS  Symptoms of acute and chronic sinusitis are the same. The primary symptoms are pain and pressure around the affected sinuses. Other symptoms include:  Upper toothache.  Earache.  Headache.  Bad breath.  Decreased sense of smell and taste.  A cough, which worsens when you are lying flat.  Fatigue.  Fever.  Thick drainage from your nose, which often is green and may contain pus (purulent).  Swelling and warmth over the affected sinuses. DIAGNOSIS  Your health care provider will perform a physical exam. During the exam, your health care provider may:  Look in your nose for signs of abnormal growths in your nostrils (nasal polyps).  Tap over the affected sinus to check for signs of  infection.  View the inside of your sinuses (endoscopy) using an imaging device that has a light attached (endoscope). If your health care provider suspects that you have chronic sinusitis, one or more of the following tests may be recommended:  Allergy tests.  Nasal culture. A sample of mucus is taken from your nose, sent to a lab, and screened for bacteria.  Nasal cytology. A sample of mucus is taken from your nose and examined by your health care provider to determine if your sinusitis is related to an allergy. TREATMENT  Most cases of acute sinusitis are related to a viral infection and will resolve on their own within 10 days. Sometimes medicines are prescribed to help relieve symptoms (pain medicine, decongestants, nasal steroid sprays, or saline sprays).  However, for sinusitis related to a bacterial infection, your health care provider will prescribe antibiotic medicines. These are medicines that will help kill the bacteria causing the infection.  Rarely, sinusitis is caused by a fungal infection. In theses cases, your health care provider will prescribe antifungal medicine. For some cases of chronic sinusitis, surgery is needed. Generally, these are cases in which sinusitis recurs more than 3 times per year, despite other treatments. HOME CARE INSTRUCTIONS   Drink plenty of water. Water helps thin the mucus so your sinuses can drain more easily.  Use a humidifier.  Inhale steam 3 to 4 times a day (for example, sit in the bathroom with the shower running).  Apply a warm, moist washcloth to your face 3 to 4 times a day, or as directed by your health care provider.  Use saline nasal  sprays to help moisten and clean your sinuses.  Take medicines only as directed by your health care provider.  If you were prescribed either an antibiotic or antifungal medicine, finish it all even if you start to feel better. SEEK IMMEDIATE MEDICAL CARE IF:  You have increasing pain or severe  headaches.  You have nausea, vomiting, or drowsiness.  You have swelling around your face.  You have vision problems.  You have a stiff neck.  You have difficulty breathing. MAKE SURE YOU:   Understand these instructions.  Will watch your condition.  Will get help right away if you are not doing well or get worse. Document Released: 01/19/2005 Document Revised: 06/05/2013 Document Reviewed: 02/03/2011 [facility] Patient Information 2015 [place], [place]. This information is not intended to replace advice given to you by your health care provider. Make sure you discuss any questions you have with your health care provider.  Upper Respiratory Infection, Adult An upper respiratory infection (URI) is also sometimes known as the common cold. The upper respiratory tract includes the nose, sinuses, throat, trachea, and bronchi. Bronchi are the airways leading to the lungs. Most people improve within 1 week, but symptoms can last up to 2 weeks. A residual cough may last even longer.  CAUSES Many different viruses can infect the tissues lining the upper respiratory tract. The tissues become irritated and inflamed and often become very moist. Mucus production is also common. A cold is contagious. You can easily spread the virus to others by oral contact. This includes kissing, sharing a glass, coughing, or sneezing. Touching your mouth or nose and then touching a surface, which is then touched by another person, can also spread the virus. SYMPTOMS  Symptoms typically develop 1 to 3 days after you come in contact with a cold virus. Symptoms vary from person to person. They may include:  Runny nose.  Sneezing.  Nasal congestion.  Sinus irritation.  Sore throat.  Loss of voice (laryngitis).  Cough.  Fatigue.  Muscle aches.  Loss of appetite.  Headache.  Low-grade fever. DIAGNOSIS  You might diagnose your own cold based on familiar symptoms, since most people get a cold 2 to 3  times a year. Your caregiver can confirm this based on your exam. Most importantly, your caregiver can check that your symptoms are not due to another disease such as strep throat, sinusitis, pneumonia, asthma, or epiglottitis. Blood tests, throat tests, and X-rays are not necessary to diagnose a common cold, but they may sometimes be helpful in excluding other more serious diseases. Your caregiver will decide if any further tests are required. RISKS AND COMPLICATIONS  You may be at risk for a more severe case of the common cold if you smoke cigarettes, have chronic heart disease (such as heart failure) or lung disease (such as asthma), or if you have a weakened immune system. The very young and very old are also at risk for more serious infections. Bacterial sinusitis, middle ear infections, and bacterial pneumonia can complicate the common cold. The common cold can worsen asthma and chronic obstructive pulmonary disease (COPD). Sometimes, these complications can require emergency medical care and may be life-threatening. PREVENTION  The best way to protect against getting a cold is to practice good hygiene. Avoid oral or hand contact with people with cold symptoms. Wash your hands often if contact occurs. There is no clear evidence that vitamin C, vitamin E, echinacea, or exercise reduces the chance of developing a cold. However, it is always recommended  to get plenty of rest and practice good nutrition. TREATMENT  Treatment is directed at relieving symptoms. There is no cure. Antibiotics are not effective, because the infection is caused by a virus, not by bacteria. Treatment may include:  Increased fluid intake. Sports drinks offer valuable electrolytes, sugars, and fluids.  Breathing heated mist or steam (vaporizer or shower).  Eating chicken soup or other clear broths, and maintaining good nutrition.  Getting plenty of rest.  Using gargles or lozenges for comfort.  Controlling fevers with  ibuprofen or acetaminophen as directed by your caregiver.  Increasing usage of your inhaler if you have asthma. Zinc gel and zinc lozenges, taken in the first 24 hours of the common cold, can shorten the duration and lessen the severity of symptoms. Pain medicines may help with fever, muscle aches, and throat pain. A variety of non-prescription medicines are available to treat congestion and runny nose. Your caregiver can make recommendations and may suggest nasal or lung inhalers for other symptoms.  HOME CARE INSTRUCTIONS   Only take over-the-counter or prescription medicines for pain, discomfort, or fever as directed by your caregiver.  Use a warm mist humidifier or inhale steam from a shower to increase air moisture. This may keep secretions moist and make it easier to breathe.  Drink enough water and fluids to keep your urine clear or pale yellow.  Rest as needed.  Return to work when your temperature has returned to normal or as your caregiver advises. You may need to stay home longer to avoid infecting others. You can also use a face mask and careful hand washing to prevent spread of the virus. SEEK MEDICAL CARE IF:   After the first few days, you feel you are getting worse rather than better.  You need your caregiver's advice about medicines to control symptoms.  You develop chills, worsening shortness of breath, or brown or red sputum. These may be signs of pneumonia.  You develop yellow or brown nasal discharge or pain in the face, especially when you bend forward. These may be signs of sinusitis.  You develop a fever, swollen neck glands, pain with swallowing, or white areas in the back of your throat. These may be signs of strep throat. SEEK IMMEDIATE MEDICAL CARE IF:   You have a fever.  You develop severe or persistent headache, ear pain, sinus pain, or chest pain.  You develop wheezing, a prolonged cough, cough up blood, or have a change in your usual mucus (if you have  chronic lung disease).  You develop sore muscles or a stiff neck. Document Released: 07/15/2000 Document Revised: 04/13/2011 Document Reviewed: 04/26/2013 [facility] Patient Information 2015 [place], [place]. This information is not intended to replace advice given to you by your health care provider. Make sure you discuss any questions you have with your health care provider.

## 2013-11-13 NOTE — ED Notes (Signed)
Pt seen by PA and up for d/c prior to assessment by this RN- d/c by Arline Asperi Masten, RN

## 2013-11-13 NOTE — ED Provider Notes (Signed)
CSN: [id number]     Arrival date & time 11/13/13  1527 History   First MD Initiated Contact with Patient 11/13/13 1539     Chief Complaint  Patient presents with  . URI     (Consider location/radiation/quality/duration/timing/severity/associated sxs/prior Treatment) HPI Comments: This is a 43 y/o female since emergency department complaining of sinus congestion x1 week, worsening over the past few days. Patient reports her ears feel full as if there is fluid behind them, she has nasal congestion, postnasal drip, slight cough and facial pain. Denies fever or chills. She has tried multiple over-the-counter medications including an over-the-counter nose spray, over-the-counter sinus medication with no relief. States she is supposed to be taking allergy medication daily, however has not done so in a while.  Patient is a 43 y.o. female presenting with URI. The history is provided by the patient.  URI Presenting symptoms: congestion, cough and ear pain     Past Medical History  Diagnosis Date  . Sciatica   . Carpal tunnel syndrome   . Cervical polyp   . Hepatitis C    Past Surgical History  Procedure Laterality Date  . Hernia repair    . Tubal ligation    . Breast enhancement surgery    . Carpal tunnel release     Family History  Problem Relation Age of Onset  . Heart disease Mother   . COPD Mother   . Cancer Father    History  Substance Use Topics  . Smoking status: Former Smoker -- 0.50 packs/day  . Smokeless tobacco: Not on file  . Alcohol Use: No   OB History   Grav Para Term Preterm Abortions TAB SAB Ect Mult Living                 Review of Systems  HENT: Positive for congestion, ear pain, postnasal drip and sinus pressure.   Respiratory: Positive for cough.   All other systems reviewed and are negative.     Allergies  Darvocet; Hydrocodone; Penicillins; and Sulfa antibiotics  Home Medications   Prior to Admission medications   Medication Sig Start Date  End Date Taking? Authorizing Provider  Acetaminophen 650 MG TABS Take 1 tablet (650 mg total) by mouth 4 (four) times daily as needed. 12/22/12   Pleas Kochhristopher Komanski, MD  albuterol (PROVENTIL HFA;VENTOLIN HFA) 108 (90 BASE) MCG/ACT inhaler Inhale 2 puffs into the lungs every 4 (four) hours as needed for wheezing or shortness of breath. 01/30/13   Gilda Creasehristopher J. Pollina, MD  azithromycin (ZITHROMAX) 250 MG tablet Take 1 tablet (250 mg total) by mouth daily. Take first 2 tablets together, then 1 every day until finished. 11/13/13   Kathrynn Speedobyn M Kameisha Malicki, PA-C  chlorpheniramine-HYDROcodone (TUSSIONEX PENNKINETIC ER) 10-8 MG/5ML LQCR Take 5 mLs by mouth every 12 (twelve) hours as needed for cough. 01/30/13   Gilda Creasehristopher J. Pollina, MD  cyclobenzaprine (FLEXERIL) 10 MG tablet Take 10 mg by mouth 3 (three) times daily as needed for muscle spasms.    Historical Provider, MD  fluticasone (FLONASE) 50 MCG/ACT nasal spray Place 2 sprays into both nostrils daily. 11/13/13   Johntavious Francom M Babbie Dondlinger, PA-C  ibuprofen (ADVIL,MOTRIN) 800 MG tablet Take 1 tablet (800 mg total) by mouth every 8 (eight) hours as needed. 12/22/12   Pleas Kochhristopher Komanski, MD  ibuprofen (ADVIL,MOTRIN) 800 MG tablet Take 1 tablet (800 mg total) by mouth 3 (three) times daily. 01/30/13   Gilda Creasehristopher J. Pollina, MD  oseltamivir (TAMIFLU) 75 MG capsule Take 1 capsule (75  mg total) by mouth every 12 (twelve) hours. 01/30/13   Gilda Creasehristopher J. Pollina, MD  oxyCODONE (ROXICODONE) 15 MG immediate release tablet Take 15 mg by mouth every 4 (four) hours as needed for pain.    Historical Provider, MD  oxyCODONE-acetaminophen (PERCOCET) 10-325 MG per tablet Take 1 tablet by mouth every 4 (four) hours as needed. For pain.    Historical Provider, MD  oxyCODONE-acetaminophen (PERCOCET/ROXICET) 5-325 MG per tablet Take 1 tablet by mouth every 4 (four) hours as needed for pain. 11/07/11   Dione Boozeavid Glick, MD  Promethazine HCl (PHENERGAN PO) Take by mouth.    Historical Provider, MD    BP 164/91  Pulse 67  Temp(Src) 98 F (36.7 C) (Oral)  Resp 20  Ht 5\' 3"  (1.6 m)  Wt 197 lb (89.359 kg)  BMI 34.91 kg/m2  SpO2 95%  LMP 10/30/2013 Physical Exam  Nursing note and vitals reviewed. Constitutional: She is oriented to person, place, and time. She appears well-developed and well-nourished. No distress.  HENT:  Head: Normocephalic and atraumatic.  Nasal congestion, mucosal edema. Postnasal drip. Bilateral maxillary sinus tenderness.  Eyes: Conjunctivae and EOM are normal.  Neck: Normal range of motion. Neck supple.  Cardiovascular: Normal rate, regular rhythm and normal heart sounds.   Pulmonary/Chest: Effort normal and breath sounds normal. No respiratory distress. She has no wheezes.  Musculoskeletal: Normal range of motion. She exhibits no edema.  Lymphadenopathy:    She has no cervical adenopathy.  Neurological: She is alert and oriented to person, place, and time. No sensory deficit.  Skin: Skin is warm and dry.  Psychiatric: She has a normal mood and affect. Her behavior is normal.    ED Course  Procedures (including critical care time) Labs Review Labs Reviewed - No data to display  Imaging Review No results found.   EKG Interpretation None      MDM   Final diagnoses:  Acute maxillary sinusitis, recurrence not specified  URI (upper respiratory infection)   Patient nontoxic appearing and in no apparent distress. Afebrile, vital signs stable. Lungs clear. Given patient's symptoms have been present for a week not improved by over-the-counter medications, will prescribe antibiotics along with Flonase. Advised to saline. Stable for discharge. Return precautions given. Patient states understanding of treatment care plan and is agreeable.  Kathrynn SpeedRobyn M Cinthya Bors, PA-C 11/13/13 1614

## 2013-11-14 NOTE — ED Provider Notes (Signed)
Medical screening examination/treatment/procedure(s) were performed by non-physician practitioner and as supervising physician I was immediately available for consultation/collaboration.   EKG Interpretation None       Graciemae Delisle, MD 11/14/13 0115 

## 2014-01-17 ENCOUNTER — Emergency Department (HOSPITAL_BASED_OUTPATIENT_CLINIC_OR_DEPARTMENT_OTHER)
Admission: EM | Admit: 2014-01-17 | Discharge: 2014-01-17 | Disposition: A | Discharge status: Home / Self Care | Payer: Medicaid Other | Attending: Emergency Medicine | Admitting: Emergency Medicine

## 2014-01-17 ENCOUNTER — Encounter (HOSPITAL_BASED_OUTPATIENT_CLINIC_OR_DEPARTMENT_OTHER): Payer: Self-pay | Admitting: Emergency Medicine

## 2014-01-17 ENCOUNTER — Emergency Department (HOSPITAL_BASED_OUTPATIENT_CLINIC_OR_DEPARTMENT_OTHER): Payer: Medicaid Other

## 2014-01-17 DIAGNOSIS — I10 Essential (primary) hypertension: Secondary | ICD-10-CM | POA: Insufficient documentation

## 2014-01-17 DIAGNOSIS — Z791 Long term (current) use of non-steroidal anti-inflammatories (NSAID): Secondary | ICD-10-CM | POA: Insufficient documentation

## 2014-01-17 DIAGNOSIS — R079 Chest pain, unspecified: Secondary | ICD-10-CM | POA: Insufficient documentation

## 2014-01-17 DIAGNOSIS — Z8742 Personal history of other diseases of the female genital tract: Secondary | ICD-10-CM | POA: Insufficient documentation

## 2014-01-17 DIAGNOSIS — Z3202 Encounter for pregnancy test, result negative: Secondary | ICD-10-CM | POA: Insufficient documentation

## 2014-01-17 DIAGNOSIS — R42 Dizziness and giddiness: Secondary | ICD-10-CM | POA: Insufficient documentation

## 2014-01-17 DIAGNOSIS — G8929 Other chronic pain: Secondary | ICD-10-CM | POA: Insufficient documentation

## 2014-01-17 DIAGNOSIS — Z880 Allergy status to penicillin: Secondary | ICD-10-CM | POA: Insufficient documentation

## 2014-01-17 DIAGNOSIS — Z87891 Personal history of nicotine dependence: Secondary | ICD-10-CM | POA: Insufficient documentation

## 2014-01-17 DIAGNOSIS — Z8619 Personal history of other infectious and parasitic diseases: Secondary | ICD-10-CM | POA: Insufficient documentation

## 2014-01-17 HISTORY — DX: Pain in leg, unspecified: M79.606

## 2014-01-17 HISTORY — DX: Low back pain, unspecified: M54.50

## 2014-01-17 HISTORY — DX: Other chronic pain: G89.29

## 2014-01-17 HISTORY — DX: Low back pain: M54.5

## 2014-01-17 HISTORY — DX: Essential (primary) hypertension: I10

## 2014-01-17 LAB — CBC WITH DIFFERENTIAL/PLATELET
BASOS PCT: 1 % (ref 0–1)
Basophils Absolute: 0 10*3/uL (ref 0.0–0.1)
EOS ABS: 0.4 10*3/uL (ref 0.0–0.7)
Eosinophils Relative: 5 % (ref 0–5)
HEMATOCRIT: 42.4 % (ref 36.0–46.0)
HEMOGLOBIN: 14.8 g/dL (ref 12.0–15.0)
LYMPHS ABS: 2.7 10*3/uL (ref 0.7–4.0)
Lymphocytes Relative: 32 % (ref 12–46)
MCH: 31.6 pg (ref 26.0–34.0)
MCHC: 34.9 g/dL (ref 30.0–36.0)
MCV: 90.4 fL (ref 78.0–100.0)
MONO ABS: 0.8 10*3/uL (ref 0.1–1.0)
MONOS PCT: 10 % (ref 3–12)
NEUTROS PCT: 52 % (ref 43–77)
Neutro Abs: 4.5 10*3/uL (ref 1.7–7.7)
Platelets: 260 10*3/uL (ref 150–400)
RBC: 4.69 MIL/uL (ref 3.87–5.11)
RDW: 12.4 % (ref 11.5–15.5)
WBC: 8.5 10*3/uL (ref 4.0–10.5)

## 2014-01-17 LAB — URINALYSIS, ROUTINE W REFLEX MICROSCOPIC
BILIRUBIN URINE: NEGATIVE
GLUCOSE, UA: NEGATIVE mg/dL
HGB URINE DIPSTICK: NEGATIVE
Ketones, ur: NEGATIVE mg/dL
Leukocytes, UA: NEGATIVE
Nitrite: NEGATIVE
PROTEIN: NEGATIVE mg/dL
Specific Gravity, Urine: 1.012 (ref 1.005–1.030)
UROBILINOGEN UA: 0.2 mg/dL (ref 0.0–1.0)
pH: 7 (ref 5.0–8.0)

## 2014-01-17 LAB — PREGNANCY, URINE: PREG TEST UR: NEGATIVE

## 2014-01-17 LAB — BASIC METABOLIC PANEL
ANION GAP: 14 (ref 5–15)
BUN: 11 mg/dL (ref 6–23)
CO2: 21 mEq/L (ref 19–32)
Calcium: 8.8 mg/dL (ref 8.4–10.5)
Chloride: 103 mEq/L (ref 96–112)
Creatinine, Ser: 0.6 mg/dL (ref 0.50–1.10)
Glucose, Bld: 114 mg/dL — ABNORMAL HIGH (ref 70–99)
POTASSIUM: 4.3 meq/L (ref 3.7–5.3)
Sodium: 138 mEq/L (ref 137–147)

## 2014-01-17 LAB — TROPONIN I: Troponin I: <0.3 ng/mL (ref <0.30)

## 2014-01-17 MED ORDER — SODIUM CHLORIDE 0.9 % IV BOLUS (SEPSIS)
1000.0000 mL | Freq: Once | INTRAVENOUS | Status: AC
Start: 2014-01-17 — End: 2014-01-17
  Administered 2014-01-17: 1000 mL via INTRAVENOUS

## 2014-01-17 MED ORDER — MECLIZINE HCL 25 MG PO TABS
25.0000 mg | ORAL_TABLET | Freq: Once | ORAL | Status: AC
Start: 2014-01-17 — End: 2014-01-17
  Administered 2014-01-17: 25 mg via ORAL
  Filled 2014-01-17: qty 1

## 2014-01-17 MED ORDER — MECLIZINE HCL 25 MG PO TABS: 25.0000 mg | ORAL_TABLET | Freq: Three times a day (TID) | ORAL | Status: AC | PRN

## 2014-01-17 MED ORDER — LABETALOL HCL 5 MG/ML IV SOLN
10.0000 mg | Freq: Once | INTRAVENOUS | Status: AC
  Administered 2014-01-17: 10 mg via INTRAVENOUS
  Filled 2014-01-17: qty 4

## 2014-01-17 NOTE — ED Provider Notes (Signed)
CSN: [id number]     Arrival date & time 01/17/14  0729 History   First MD Initiated Contact with Patient 01/17/14 0732     Chief Complaint  Patient presents with  . Dizziness  . Chest Pain     (Consider location/radiation/quality/duration/timing/severity/associated sxs/prior Treatment) Patient is a 43 y.o. female presenting with dizziness.  Dizziness Quality:  Lightheadedness Severity:  Severe Onset quality:  Sudden Duration:  1 hour Timing:  Constant Progression:  Unchanged Chronicity:  New Context: standing up   Relieved by:  Nothing Worsened by:  Movement Associated symptoms: chest pain   Associated symptoms: no blood in stool, no headaches, no shortness of breath, no tinnitus, no vision changes and no vomiting     Past Medical History  Diagnosis Date  . Sciatica   . Carpal tunnel syndrome   . Cervical polyp   . Hepatitis C   . Hypertension   . Chronic lower back pain   . Chronic leg pain    Past Surgical History  Procedure Laterality Date  . Hernia repair    . Tubal ligation    . Breast enhancement surgery    . Carpal tunnel release     Family History  Problem Relation Age of Onset  . Heart disease Mother   . COPD Mother   . Cancer Father    History  Substance Use Topics  . Smoking status: Former Smoker -- 0.50 packs/day  . Smokeless tobacco: Not on file  . Alcohol Use: No   OB History    No data available     Review of Systems  HENT: Negative for tinnitus.   Respiratory: Negative for shortness of breath.   Cardiovascular: Positive for chest pain.  Gastrointestinal: Negative for vomiting and blood in stool.  Neurological: Positive for dizziness. Negative for headaches.  All other systems reviewed and are negative.     Allergies  Darvocet; Hydrocodone; Penicillins; and Sulfa antibiotics  Home Medications   Prior to Admission medications   Medication Sig Start Date End Date Taking? Authorizing Provider  ibuprofen (ADVIL,MOTRIN) 800 MG  tablet Take 1 tablet (800 mg total) by mouth 3 (three) times daily. 01/30/13  Yes Gilda Creasehristopher J. Pollina, MD  oxyCODONE (ROXICODONE) 15 MG immediate release tablet Take 15 mg by mouth every 4 (four) hours as needed for pain.   Yes Historical Provider, MD  meclizine (ANTIVERT) 25 MG tablet Take 1 tablet (25 mg total) by mouth 3 (three) times daily as needed for dizziness. 01/17/14   Mirian MoMatthew Gentry, MD   BP 146/90 mmHg  Pulse 57  Resp 13  SpO2 97%  LMP 12/24/2013 Physical Exam  Constitutional: She is oriented to person, place, and time. She appears well-developed and well-nourished.  HENT:  Head: Normocephalic and atraumatic.  Right Ear: External ear normal.  Left Ear: External ear normal.  Eyes: Conjunctivae and EOM are normal. Pupils are equal, round, and reactive to light.  Neck: Normal range of motion. Neck supple.  Cardiovascular: Normal rate, regular rhythm, normal heart sounds and intact distal pulses.   Pulmonary/Chest: Effort normal and breath sounds normal.  Abdominal: Soft. Bowel sounds are normal. There is no tenderness.  Musculoskeletal: Normal range of motion.  Neurological: She is alert and oriented to person, place, and time. She has normal strength. No cranial nerve deficit or sensory deficit.  Reproduction of symptoms with head movement.  Bil nystagmus.  No ataxia  Skin: Skin is warm and dry.  Vitals reviewed.   ED Course  Procedures (  including critical care time) Labs Review Labs Reviewed  BASIC METABOLIC PANEL - Abnormal; Notable for the following:    Glucose, Bld 114 (*)    All other components within normal limits  CBC WITH DIFFERENTIAL  URINALYSIS, ROUTINE W REFLEX MICROSCOPIC  TROPONIN I  PREGNANCY, URINE    Imaging Review Dg Chest 2 View  01/17/2014   CLINICAL DATA:  Sudden onset dizziness with chest tightness.  EXAM: CHEST  2 VIEW  COMPARISON:  12/28/2011.  FINDINGS: Trachea is midline. Heart size normal. Lungs are clear. No pleural fluid.   IMPRESSION: No acute findings.   Electronically Signed   By: Leanna BattlesMelinda  Blietz M.D.   On: 01/17/2014 07:59     EKG Interpretation   Date/Time:  Wednesday January 17 2014 07:37:50 EST Ventricular Rate:  74 PR Interval:  178 QRS Duration: 74 QT Interval:  390 QTC Calculation: 432 R Axis:   82 Text Interpretation:  Normal sinus rhythm T wave abnormality, consider  anterior ischemia Abnormal ECG No significant change since last tracing  Confirmed by Mirian MoGentry, Matthew [phone] on 01/17/2014 8:28:22 AM      MDM   Final diagnoses:  Lightheadedness  Dizziness    43 y.o. female with pertinent PMH of hep C, chronic back pain presents with dizziness.  I attempted on multiple occasions to differentiate between lightheadedness and vertigo, the patient could not elaborate further.  Symptoms began shortly prior to arrival this morning, have been intermittent over the last 2 weeks.  On arrival today vitals signs and physical exam as above.  Patient was given normal saline bolus, meclizine, labetalol for hypertension. Workup returned as above unremarkable. Symptoms relieved. Discussed with patient brought differential at this time, however without chest pain, dyspnea, other concerning symptoms do not feel admission is warranted. Given vague symptoms will have the patient follow-up with primary care doctor. She was given strict return precautions for recurrent symptoms, voiced understanding and agreed to follow-up  I have reviewed all laboratory and imaging studies if ordered as above  1. Dizziness   2. Lightheadedness         Mirian MoMatthew Gentry, MD 01/17/14 [phone]

## 2014-01-17 NOTE — ED Notes (Signed)
Pt states she has been having dizziness for two weeks.  Some intermittent chest pain.  Pt states she did have a syncopal episode 2 weeks ago.  Pt did have some facial numbness and some arm pressure.

## 2014-01-17 NOTE — Discharge Instructions (Signed)

## 2014-01-17 NOTE — ED Notes (Signed)
MD at bedside discussing results with patient 

## 2014-02-11 ENCOUNTER — Emergency Department (HOSPITAL_BASED_OUTPATIENT_CLINIC_OR_DEPARTMENT_OTHER): Payer: Worker's Compensation

## 2014-02-11 ENCOUNTER — Encounter (HOSPITAL_BASED_OUTPATIENT_CLINIC_OR_DEPARTMENT_OTHER): Payer: Self-pay | Admitting: Emergency Medicine

## 2014-02-11 ENCOUNTER — Emergency Department (HOSPITAL_BASED_OUTPATIENT_CLINIC_OR_DEPARTMENT_OTHER)
Admission: EM | Admit: 2014-02-11 | Discharge: 2014-02-11 | Disposition: A | Discharge status: Home / Self Care | Payer: Worker's Compensation | Attending: Emergency Medicine | Admitting: Emergency Medicine

## 2014-02-11 DIAGNOSIS — Y9389 Activity, other specified: Secondary | ICD-10-CM | POA: Insufficient documentation

## 2014-02-11 DIAGNOSIS — Z880 Allergy status to penicillin: Secondary | ICD-10-CM | POA: Insufficient documentation

## 2014-02-11 DIAGNOSIS — G8929 Other chronic pain: Secondary | ICD-10-CM | POA: Diagnosis not present

## 2014-02-11 DIAGNOSIS — Z8739 Personal history of other diseases of the musculoskeletal system and connective tissue: Secondary | ICD-10-CM | POA: Diagnosis not present

## 2014-02-11 DIAGNOSIS — Y9289 Other specified places as the place of occurrence of the external cause: Secondary | ICD-10-CM | POA: Insufficient documentation

## 2014-02-11 DIAGNOSIS — S92355A Nondisplaced fracture of fifth metatarsal bone, left foot, initial encounter for closed fracture: Secondary | ICD-10-CM | POA: Insufficient documentation

## 2014-02-11 DIAGNOSIS — Z79899 Other long term (current) drug therapy: Secondary | ICD-10-CM | POA: Insufficient documentation

## 2014-02-11 DIAGNOSIS — R52 Pain, unspecified: Secondary | ICD-10-CM

## 2014-02-11 DIAGNOSIS — X58XXXA Exposure to other specified factors, initial encounter: Secondary | ICD-10-CM | POA: Insufficient documentation

## 2014-02-11 DIAGNOSIS — I10 Essential (primary) hypertension: Secondary | ICD-10-CM | POA: Insufficient documentation

## 2014-02-11 DIAGNOSIS — S99912A Unspecified injury of left ankle, initial encounter: Secondary | ICD-10-CM | POA: Diagnosis present

## 2014-02-11 DIAGNOSIS — Y990 Civilian activity done for income or pay: Secondary | ICD-10-CM | POA: Diagnosis not present

## 2014-02-11 DIAGNOSIS — Z8619 Personal history of other infectious and parasitic diseases: Secondary | ICD-10-CM | POA: Insufficient documentation

## 2014-02-11 DIAGNOSIS — S92352A Displaced fracture of fifth metatarsal bone, left foot, initial encounter for closed fracture: Secondary | ICD-10-CM

## 2014-02-11 MED ORDER — IBUPROFEN 800 MG PO TABS
800.0000 mg | ORAL_TABLET | Freq: Once | ORAL | Status: AC
  Administered 2014-02-11: 800 mg via ORAL
  Filled 2014-02-11: qty 1

## 2014-02-11 NOTE — ED Notes (Signed)
PT presents to ED with complaints of left ankle and foot pain pt states she tripped and heard a popped in her left foot

## 2014-02-11 NOTE — ED Provider Notes (Signed)
CSN: [id number]     Arrival date & time 02/11/14  1210 History   First MD Initiated Contact with Patient 02/11/14 1327     Chief Complaint  Patient presents with  . Ankle Pain  . Foot Injury     (Consider location/radiation/quality/duration/timing/severity/associated sxs/prior Treatment) HPI 44 year old female who inverted her left ankle at work today. She states that she heard a popping sensation and had severe pain on the lateral aspect of the left foot. She has been able to walk on her heel but not bear weight onto the foot. This occurred just prior to coming into the ED. She describes the pain as severe.. Past Medical History  Diagnosis Date  . Sciatica   . Carpal tunnel syndrome   . Cervical polyp   . Hepatitis C   . Hypertension   . Chronic lower back pain   . Chronic leg pain    Past Surgical History  Procedure Laterality Date  . Hernia repair    . Tubal ligation    . Breast enhancement surgery    . Carpal tunnel release     Family History  Problem Relation Age of Onset  . Heart disease Mother   . COPD Mother   . Cancer Father    History  Substance Use Topics  . Smoking status: Former Smoker -- 0.50 packs/day  . Smokeless tobacco: Not on file  . Alcohol Use: No   OB History    No data available     Review of Systems  All other systems reviewed and are negative.     Allergies  Darvocet; Hydrocodone; Penicillins; and Sulfa antibiotics  Home Medications   Prior to Admission medications   Medication Sig Start Date End Date Taking? Authorizing Provider  oxyCODONE (ROXICODONE) 15 MG immediate release tablet Take 15 mg by mouth every 4 (four) hours as needed for pain.   Yes Historical Provider, MD  ibuprofen (ADVIL,MOTRIN) 800 MG tablet Take 1 tablet (800 mg total) by mouth 3 (three) times daily. 01/30/13   Gilda Crease, MD  meclizine (ANTIVERT) 25 MG tablet Take 1 tablet (25 mg total) by mouth 3 (three) times daily as needed for dizziness.  01/17/14   Mirian Mo, MD   BP 156/96 mmHg  Pulse 66  Temp(Src) 98.5 F (36.9 C) (Oral)  Resp 18  Ht  (1.575 m)  Wt 195 lb (88.451 kg)  BMI 35.66 kg/m2  SpO2 96%  LMP 01/24/2014 Physical Exam  Constitutional: She is oriented to person, place, and time. She appears well-developed and well-nourished. No distress.  HENT:  Head: Normocephalic and atraumatic.  Right Ear: External ear normal.  Left Ear: External ear normal.  Nose: Nose normal.  Eyes: Conjunctivae and EOM are normal. Pupils are equal, round, and reactive to light.  Neck: Normal range of motion. Neck supple.  Pulmonary/Chest: Effort normal.  Musculoskeletal: Normal range of motion. She exhibits tenderness.       Feet:  Dorsal pedalis pulse is intact in left foot. Toes are pink and capillary refill is less than 2 seconds. Ankle joint appears stable. Knee is stable with no acute bony tenderness palpation  Neurological: She is alert and oriented to person, place, and time. She exhibits normal muscle tone. Coordination normal.  Skin: Skin is warm and dry.  Psychiatric: She has a normal mood and affect. Her behavior is normal. Thought content normal.  Nursing note and vitals reviewed.   ED Course  Procedures (including critical care time) Labs Review  Labs Reviewed - No data to display  Imaging Review Dg Ankle Complete Left  02/11/2014   CLINICAL DATA:  Patient states she rolled her left foot/left ankle today at work and heard a pop. C/o lateral pain with swelling.  EXAM: LEFT FOOT - COMPLETE 3+ VIEW; LEFT ANKLE COMPLETE - 3+ VIEW  COMPARISON:  None.  FINDINGS: LEFT FOOT AND ANKLE  There is a nondisplaced fracture of the base of the left fifth metatarsal. There is no other fracture or dislocation. The ankle mortise is intact. There is soft tissue swelling over the base of the left fifth metatarsal.  There is hallux valgus with mild osteoarthritis of the first MTP joint.  IMPRESSION: Nondisplaced fracture at the base  of the left fifth metatarsal.  No acute osseous injury of the left ankle.   Electronically Signed   By: Elige KoHetal  Patel   On: 02/11/2014 13:55   Dg Foot Complete Left  02/11/2014   CLINICAL DATA:  Patient states she rolled her left foot/left ankle today at work and heard a pop. C/o lateral pain with swelling.  EXAM: LEFT FOOT - COMPLETE 3+ VIEW; LEFT ANKLE COMPLETE - 3+ VIEW  COMPARISON:  None.  FINDINGS: LEFT FOOT AND ANKLE  There is a nondisplaced fracture of the base of the left fifth metatarsal. There is no other fracture or dislocation. The ankle mortise is intact. There is soft tissue swelling over the base of the left fifth metatarsal.  There is hallux valgus with mild osteoarthritis of the first MTP joint.  IMPRESSION: Nondisplaced fracture at the base of the left fifth metatarsal.  No acute osseous injury of the left ankle.   Electronically Signed   By: Elige KoHetal  Patel   On: 02/11/2014 13:55     EKG Interpretation None      MDM   Final diagnoses:  Closed fracture of base of fifth metatarsal bone, left, initial encounter    Plan posterior splint, crutches, and orthopedic follow-up    Hilario Quarryanielle S Favor Kreh, MD 02/11/14 1557

## 2014-02-11 NOTE — Discharge Instructions (Signed)
Metatarsal Fracture, Undisplaced  A metatarsal fracture is a break in the bone(s) of the foot. These are the bones of the foot that connect your toes to the bones of the ankle.  DIAGNOSIS   The diagnoses of these fractures are usually made with X-rays. If there are problems in the forefoot and x-rays are normal a later bone scan will usually make the diagnosis.   TREATMENT AND HOME CARE INSTRUCTIONS  · Treatment may or may not include a cast or walking shoe. When casts are needed the use is usually for short periods of time so as not to slow down healing with muscle wasting (atrophy).  · Activities should be stopped until further advised by your caregiver.  · Wear shoes with adequate shock absorbing capabilities and stiff soles.  · Alternative exercise may be undertaken while waiting for healing. These may include bicycling and swimming, or as your caregiver suggests.  · It is important to keep all follow-up visits or specialty referrals. The failure to keep these appointments could result in improper bone healing and chronic pain or disability.  · Warning: Do not drive a car or operate a motor vehicle until your caregiver specifically tells you it is safe to do so.  IF YOU DO NOT HAVE A CAST OR SPLINT:  · You may walk on your injured foot as tolerated or advised.  · Do not put any weight on your injured foot for as long as directed by your caregiver. Slowly increase the amount of time you walk on the foot as the pain allows or as advised.  · Use crutches until you can bear weight without pain. A gradual increase in weight bearing may help.  · Apply ice to the injury for 15-20 minutes each hour while awake for the first 2 days. Put the ice in a plastic bag and place a towel between the bag of ice and your skin.  · Only take over-the-counter or prescription medicines for pain, discomfort, or fever as directed by your caregiver.  SEEK IMMEDIATE MEDICAL CARE IF:   · Your cast gets damaged or breaks.  · You have  continued severe pain or more swelling than you did before the cast was put on, or the pain is not controlled with medications.  · Your skin or nails below the injury turn blue or grey, or feel cold or numb.  · There is a bad smell, or new stains or pus-like (purulent) drainage coming from the cast.  MAKE SURE YOU:   · Understand these instructions.  · Will watch your condition.  · Will get help right away if you are not doing well or get worse.  Document Released: 10/11/2001 Document Revised: 04/13/2011 Document Reviewed: 09/02/2007  [employer]® Patient Information ©2015 [employer]. This information is not intended to replace advice given to you by your health care provider. Make sure you discuss any questions you have with your health care provider.

## 2014-02-12 ENCOUNTER — Ambulatory Visit (INDEPENDENT_AMBULATORY_CARE_PROVIDER_SITE_OTHER): Payer: Worker's Compensation | Admitting: Family Medicine

## 2014-02-12 ENCOUNTER — Encounter: Payer: Self-pay | Admitting: Family Medicine

## 2014-02-12 VITALS — BP 160/91 | HR 67 | Ht 62.0 in | Wt 195.0 lb

## 2014-02-12 DIAGNOSIS — S92355A Nondisplaced fracture of fifth metatarsal bone, left foot, initial encounter for closed fracture: Secondary | ICD-10-CM

## 2014-02-12 NOTE — Patient Instructions (Signed)
You have an avulsion fracture of the base of your 5th metatarsal of your left foot. These typically take 6 weeks to heal. Wear cam walker every time you're up and walking around. Use crutches or cane as needed. Icing 15 minutes at a time 3-4 times a day. Elevate above the level of your heart when possible. I would recommend contacting the physician who prescribes your pain medication if you need a higher dose or more than your usual medication. Follow up with me in 2 weeks. Out of work in the meantime.

## 2014-02-14 DIAGNOSIS — S92355D Nondisplaced fracture of fifth metatarsal bone, left foot, subsequent encounter for fracture with routine healing: Secondary | ICD-10-CM | POA: Insufficient documentation

## 2014-02-14 NOTE — Assessment & Plan Note (Signed)
Left 5th metatarsal avulsion fracture - Switch to cam walker.  Icing, elevation.  Takes oxycodone regularly for chronic back pain.  Out of work for next 2 weeks.  Ok to bear weight as tolerated.  Crutches as needed.  F/u in 2 weeks.

## 2014-02-14 NOTE — Progress Notes (Signed)
PCP: Saralyn PilarKaramalegos, Alexander, DO  Subjective:   HPI: Patient is a 44 y.o. female here for left foot injury.  Patient reports she works at [employer]. Yesterday while at work she accidentally inverted her left ankle. Felt and heard a pop lateral left foot with a lot of pain. Associated swelling and bruising. No prior injuries. Went to ED where radiographs showed an avulsion fracture of base of 5th metatarsal. Placed in a posterior splint with crutches and referred here.  Past Medical History  Diagnosis Date  . Sciatica   . Carpal tunnel syndrome   . Cervical polyp   . Hepatitis C   . Hypertension   . Chronic lower back pain   . Chronic leg pain     Current Outpatient Prescriptions on File Prior to Visit  Medication Sig Dispense Refill  . ibuprofen (ADVIL,MOTRIN) 800 MG tablet Take 1 tablet (800 mg total) by mouth 3 (three) times daily. 21 tablet 0  . meclizine (ANTIVERT) 25 MG tablet Take 1 tablet (25 mg total) by mouth 3 (three) times daily as needed for dizziness. 30 tablet 0  . oxyCODONE (ROXICODONE) 15 MG immediate release tablet Take 15 mg by mouth every 4 (four) hours as needed for pain.     No current facility-administered medications on file prior to visit.    Past Surgical History  Procedure Laterality Date  . Hernia repair    . Tubal ligation    . Breast enhancement surgery    . Carpal tunnel release      Allergies  Allergen Reactions  . Darvocet [Propoxyphene N-Acetaminophen]     GI Upset  . Hydrocodone Nausea Only  . Penicillins Hives  . Sulfa Antibiotics Hives and Itching    History   Social History  . Marital Status: Married    Spouse Name: N/A    Number of Children: N/A  . Years of Education: N/A   Occupational History  . Not on file.   Social History Main Topics  . Smoking status: Former Smoker -- 0.50 packs/day  . Smokeless tobacco: Not on file  . Alcohol Use: No  . Drug Use: No  . Sexual Activity: Not on file   Other Topics Concern  .  Not on file   Social History Narrative    Family History  Problem Relation Age of Onset  . Heart disease Mother   . COPD Mother   . Cancer Father     BP 160/91 mmHg  Pulse 67  Ht 5\' 2"  (1.575 m)  Wt 195 lb (88.451 kg)  BMI 35.66 kg/m2  LMP 01/24/2014  Review of Systems: See HPI above.    Objective:  Physical Exam:  Gen: NAD  Left foot/ankle: Mod swelling, bruising lateral foot.  No other deformity. Did not test extent of ROM with known fracture. TTP base 5th, less so across dorsal foot. Negative ant drawer and talar tilt.   Negative syndesmotic compression. Thompsons test negative. NV intact distally.    Assessment & Plan:  1. Left 5th metatarsal avulsion fracture - Switch to cam walker.  Icing, elevation.  Takes oxycodone regularly for chronic back pain.  Out of work for next 2 weeks.  Ok to bear weight as tolerated.  Crutches as needed.  F/u in 2 weeks.

## 2014-02-26 ENCOUNTER — Ambulatory Visit: Payer: Medicaid Other | Admitting: Family Medicine

## 2014-02-28 ENCOUNTER — Encounter: Payer: Self-pay | Admitting: Family Medicine

## 2014-02-28 ENCOUNTER — Ambulatory Visit (HOSPITAL_BASED_OUTPATIENT_CLINIC_OR_DEPARTMENT_OTHER)
Admission: RE | Admit: 2014-02-28 | Discharge: 2014-02-28 | Disposition: A | Discharge status: Home / Self Care | Payer: Worker's Compensation | Source: Ambulatory Visit | Attending: Family Medicine | Admitting: Family Medicine

## 2014-02-28 ENCOUNTER — Ambulatory Visit (INDEPENDENT_AMBULATORY_CARE_PROVIDER_SITE_OTHER): Payer: Worker's Compensation | Admitting: Family Medicine

## 2014-02-28 VITALS — BP 181/115 | HR 76 | Ht 62.0 in | Wt 195.0 lb

## 2014-02-28 DIAGNOSIS — S92355A Nondisplaced fracture of fifth metatarsal bone, left foot, initial encounter for closed fracture: Secondary | ICD-10-CM

## 2014-02-28 DIAGNOSIS — X58XXXD Exposure to other specified factors, subsequent encounter: Secondary | ICD-10-CM | POA: Diagnosis not present

## 2014-02-28 DIAGNOSIS — S92302A Fracture of unspecified metatarsal bone(s), left foot, initial encounter for closed fracture: Secondary | ICD-10-CM

## 2014-02-28 DIAGNOSIS — S92352D Displaced fracture of fifth metatarsal bone, left foot, subsequent encounter for fracture with routine healing: Secondary | ICD-10-CM | POA: Diagnosis present

## 2014-02-28 NOTE — Patient Instructions (Signed)
You have an avulsion fracture of the base of your 5th metatarsal of your left foot. These typically take 6 weeks to heal. Wear cam walker every time you're up and walking around. Use crutches or cane as needed. Icing 15 minutes at a time 3-4 times a day. Elevate above the level of your heart when possible. I would recommend contacting the physician who prescribes your pain medication if you need a higher dose or more than your usual medication. Follow up with me in 4 weeks. Out of work in the meantime.

## 2014-03-05 NOTE — Assessment & Plan Note (Signed)
Left 5th metatarsal avulsion fracture - Repeat radiographs without displacement but no early healing yet.  Continue cam walker, icing, elevation.  Takes oxycodone regularly for chronic back pain.  Out of work for next 4 weeks.  Ok to bear weight as tolerated.  Crutches as needed.  F/u in 4 weeks.

## 2014-03-05 NOTE — Progress Notes (Signed)
PCP: Saralyn PilarKaramalegos, Alexander, DO  Subjective:   HPI: Patient is a 44 y.o. female here for left foot injury.  1/11: Patient reports she works at [employer]. Yesterday while at work she accidentally inverted her left ankle. Felt and heard a pop lateral left foot with a lot of pain. Associated swelling and bruising. No prior injuries. Went to ED where radiographs showed an avulsion fracture of base of 5th metatarsal. Placed in a posterior splint with crutches and referred here.  1/27: Patient reports she continues to struggle with pain at 8/10 level 5th metatarsal. Has been wearing cam walker. Not putting much pressure on outside of foot in the boot through. Taking pain medication as prescribed by her pain clinic.  Past Medical History  Diagnosis Date  . Sciatica   . Carpal tunnel syndrome   . Cervical polyp   . Hepatitis C   . Hypertension   . Chronic lower back pain   . Chronic leg pain     Current Outpatient Prescriptions on File Prior to Visit  Medication Sig Dispense Refill  . gabapentin (NEURONTIN) 300 MG capsule   0  . ibuprofen (ADVIL,MOTRIN) 800 MG tablet Take 1 tablet (800 mg total) by mouth 3 (three) times daily. 21 tablet 0  . meclizine (ANTIVERT) 25 MG tablet Take 1 tablet (25 mg total) by mouth 3 (three) times daily as needed for dizziness. 30 tablet 0  . oxyCODONE (ROXICODONE) 15 MG immediate release tablet Take 15 mg by mouth every 4 (four) hours as needed for pain.     No current facility-administered medications on file prior to visit.    Past Surgical History  Procedure Laterality Date  . Hernia repair    . Tubal ligation    . Breast enhancement surgery    . Carpal tunnel release      Allergies  Allergen Reactions  . Darvocet [Propoxyphene N-Acetaminophen]     GI Upset  . Hydrocodone Nausea Only  . Penicillins Hives  . Sulfa Antibiotics Hives and Itching    History   Social History  . Marital Status: Married    Spouse Name: N/A    Number of  Children: N/A  . Years of Education: N/A   Occupational History  . Not on file.   Social History Main Topics  . Smoking status: Former Smoker -- 0.50 packs/day  . Smokeless tobacco: Not on file  . Alcohol Use: No  . Drug Use: No  . Sexual Activity: Not on file   Other Topics Concern  . Not on file   Social History Narrative    Family History  Problem Relation Age of Onset  . Heart disease Mother   . COPD Mother   . Cancer Father     BP 181/115 mmHg  Pulse 76  Ht 5\' 2"  (1.575 m)  Wt 195 lb (88.451 kg)  BMI 35.66 kg/m2  LMP 02/19/2014  Review of Systems: See HPI above.    Objective:  Physical Exam:  Gen: NAD  Left foot/ankle: Mild swelling, no bruising lateral foot.  No other deformity. Did not test extent of ROM with known fracture. TTP base 5th, less so across dorsal foot. Negative ant drawer and talar tilt.   Negative syndesmotic compression. Thompsons test negative. NV intact distally.    Assessment & Plan:  1. Left 5th metatarsal avulsion fracture - Repeat radiographs without displacement but no early healing yet.  Continue cam walker, icing, elevation.  Takes oxycodone regularly for chronic back pain.  Out  of work for next 4 weeks.  Ok to bear weight as tolerated.  Crutches as needed.  F/u in 4 weeks.

## 2014-03-28 ENCOUNTER — Ambulatory Visit: Payer: Worker's Compensation | Admitting: Family Medicine

## 2014-04-04 ENCOUNTER — Ambulatory Visit: Payer: Worker's Compensation | Admitting: Family Medicine

## 2014-04-05 ENCOUNTER — Ambulatory Visit (HOSPITAL_BASED_OUTPATIENT_CLINIC_OR_DEPARTMENT_OTHER)
Admission: RE | Admit: 2014-04-05 | Discharge: 2014-04-05 | Disposition: A | Discharge status: Home / Self Care | Payer: Medicaid Other | Source: Ambulatory Visit | Attending: Family Medicine | Admitting: Family Medicine

## 2014-04-05 ENCOUNTER — Encounter: Payer: Self-pay | Admitting: Family Medicine

## 2014-04-05 ENCOUNTER — Ambulatory Visit: Payer: Worker's Compensation | Admitting: Family Medicine

## 2014-04-05 ENCOUNTER — Ambulatory Visit (INDEPENDENT_AMBULATORY_CARE_PROVIDER_SITE_OTHER): Payer: Worker's Compensation | Admitting: Family Medicine

## 2014-04-05 VITALS — BP 165/96 | Ht 62.0 in | Wt 195.0 lb

## 2014-04-05 DIAGNOSIS — S92352D Displaced fracture of fifth metatarsal bone, left foot, subsequent encounter for fracture with routine healing: Secondary | ICD-10-CM | POA: Insufficient documentation

## 2014-04-05 DIAGNOSIS — S92302G Fracture of unspecified metatarsal bone(s), left foot, subsequent encounter for fracture with delayed healing: Secondary | ICD-10-CM

## 2014-04-05 DIAGNOSIS — X58XXXD Exposure to other specified factors, subsequent encounter: Secondary | ICD-10-CM | POA: Insufficient documentation

## 2014-04-05 DIAGNOSIS — S92355A Nondisplaced fracture of fifth metatarsal bone, left foot, initial encounter for closed fracture: Secondary | ICD-10-CM

## 2014-04-05 NOTE — Patient Instructions (Signed)
Transition out of the boot now to a supportive shoe. I think this will help you tremendously with all of these issues. Start physical therapy and home exercises for sciatica and your left foot/ankle. Follow up with me in 4 weeks. Call us on Monday afternoon if you haven't heard from therapy. Out of work in the meantime.

## 2014-04-10 NOTE — Progress Notes (Signed)
PCP: Saralyn PilarKaramalegos, Alexander, DO  Subjective:   HPI: Patient is a 44 y.o. female here for left foot injury.  1/11: Patient reports she works at [employer]. Yesterday while at work she accidentally inverted her left ankle. Felt and heard a pop lateral left foot with a lot of pain. Associated swelling and bruising. No prior injuries. Went to ED where radiographs showed an avulsion fracture of base of 5th metatarsal. Placed in a posterior splint with crutches and referred here.  1/27: Patient reports she continues to struggle with pain at 8/10 level 5th metatarsal. Has been wearing cam walker. Not putting much pressure on outside of foot in the boot through. Taking pain medication as prescribed by her pain clinic.  3/3: Patient reports she's continued to struggle with pain in her left foot. Using cam walker. Feels like this is making her low back and right knee flare up - has history of chronic low back pain and right knee pain in the chart.  Past Medical History  Diagnosis Date  . Sciatica   . Carpal tunnel syndrome   . Cervical polyp   . Hepatitis C   . Hypertension   . Chronic lower back pain   . Chronic leg pain     Current Outpatient Prescriptions on File Prior to Visit  Medication Sig Dispense Refill  . gabapentin (NEURONTIN) 300 MG capsule   0  . ibuprofen (ADVIL,MOTRIN) 800 MG tablet Take 1 tablet (800 mg total) by mouth 3 (three) times daily. 21 tablet 0  . meclizine (ANTIVERT) 25 MG tablet Take 1 tablet (25 mg total) by mouth 3 (three) times daily as needed for dizziness. 30 tablet 0  . oxyCODONE (ROXICODONE) 15 MG immediate release tablet Take 15 mg by mouth every 4 (four) hours as needed for pain.     No current facility-administered medications on file prior to visit.    Past Surgical History  Procedure Laterality Date  . Hernia repair    . Tubal ligation    . Breast enhancement surgery    . Carpal tunnel release      Allergies  Allergen Reactions  .  Penicillins Hives and Rash  . Sulfa Antibiotics Hives, Itching and Rash  . Darvocet [Propoxyphene N-Acetaminophen]     GI Upset  . Anesthetics, Halogenated Nausea Only  . Hydrocodone Nausea Only    History   Social History  . Marital Status: Married    Spouse Name: N/A  . Number of Children: N/A  . Years of Education: N/A   Occupational History  . Not on file.   Social History Main Topics  . Smoking status: Former Smoker -- 0.50 packs/day  . Smokeless tobacco: Not on file  . Alcohol Use: No  . Drug Use: No  . Sexual Activity: Not on file   Other Topics Concern  . Not on file   Social History Narrative    Family History  Problem Relation Age of Onset  . Heart disease Mother   . COPD Mother   . Cancer Father     BP 165/96 mmHg  Ht 5\' 2"  (1.575 m)  Wt 195 lb (88.451 kg)  BMI 35.66 kg/m2  Review of Systems: See HPI above.    Objective:  Physical Exam:  Gen: NAD  Left foot/ankle: Mild swelling, no bruising lateral foot.  No other deformity. Mod limitation all motions of ankle (tested after radiographs). No TTP base 5th - now with tenderness mainly across dorsal foot. Negative ant drawer and talar  tilt.   Negative syndesmotic compression. Thompsons test negative. NV intact distally.    Assessment & Plan:  1. Left 5th metatarsal avulsion fracture - Repeat radiographs without displacement and some filling of the fracture, very encouraging.  Most of issues now I believe are related to stiffness of the ankle and having to wear boot (more pain right knee and back as a result).  These should improve by switching to a regular shoe.  Start physical therapy and home exercises.  Continue out of work until f/u in 4 weeks due to level of pain.  Consider more extensive exam and evaluation of knee and back (along with prior records) if discontinuation of boot does not help with right sided pain.

## 2014-04-10 NOTE — Assessment & Plan Note (Signed)
Left 5th metatarsal avulsion fracture - Repeat radiographs without displacement and some filling of the fracture, very encouraging.  Most of issues now I believe are related to stiffness of the ankle and having to wear boot (more pain right knee and back as a result).  These should improve by switching to a regular shoe.  Start physical therapy and home exercises.  Continue out of work until f/u in 4 weeks due to level of pain.  Consider more extensive exam and evaluation of knee and back (along with prior records) if discontinuation of boot does not help with right sided pain.

## 2014-04-16 ENCOUNTER — Telehealth: Payer: Self-pay | Admitting: Family Medicine

## 2014-04-16 NOTE — Addendum Note (Signed)
Addended by: Kathi SimpersWISE, Lyle Niblett F on: 04/16/2014 03:53 PM   Modules accepted: Orders

## 2014-04-17 NOTE — Telephone Encounter (Signed)
[facility]

## 2014-05-03 ENCOUNTER — Ambulatory Visit: Payer: Worker's Compensation | Admitting: Family Medicine

## 2014-05-09 ENCOUNTER — Ambulatory Visit: Payer: Worker's Compensation | Admitting: Family Medicine

## 2014-05-10 ENCOUNTER — Ambulatory Visit (INDEPENDENT_AMBULATORY_CARE_PROVIDER_SITE_OTHER): Payer: Worker's Compensation | Admitting: Family Medicine

## 2014-05-10 ENCOUNTER — Encounter: Payer: Self-pay | Admitting: Family Medicine

## 2014-05-10 ENCOUNTER — Ambulatory Visit (HOSPITAL_BASED_OUTPATIENT_CLINIC_OR_DEPARTMENT_OTHER)
Admission: RE | Admit: 2014-05-10 | Discharge: 2014-05-10 | Disposition: A | Discharge status: Home / Self Care | Payer: Worker's Compensation | Source: Ambulatory Visit | Attending: Family Medicine | Admitting: Family Medicine

## 2014-05-10 VITALS — BP 155/104 | HR 74 | Ht 62.0 in | Wt 206.0 lb

## 2014-05-10 DIAGNOSIS — M79672 Pain in left foot: Secondary | ICD-10-CM | POA: Diagnosis not present

## 2014-05-10 DIAGNOSIS — S92355A Nondisplaced fracture of fifth metatarsal bone, left foot, initial encounter for closed fracture: Secondary | ICD-10-CM | POA: Diagnosis not present

## 2014-05-10 DIAGNOSIS — M2012 Hallux valgus (acquired), left foot: Secondary | ICD-10-CM | POA: Insufficient documentation

## 2014-05-10 DIAGNOSIS — S92352G Displaced fracture of fifth metatarsal bone, left foot, subsequent encounter for fracture with delayed healing: Secondary | ICD-10-CM | POA: Insufficient documentation

## 2014-05-10 DIAGNOSIS — X58XXXD Exposure to other specified factors, subsequent encounter: Secondary | ICD-10-CM | POA: Insufficient documentation

## 2014-05-10 NOTE — Patient Instructions (Signed)
Follow up with me in 4 weeks. Continue with physical therapy for your back and your foot. See work note for details - anticipate you returning to light duty at follow-up.

## 2014-05-15 NOTE — Progress Notes (Signed)
PCP: Saralyn PilarKaramalegos, Alexander, DO  Subjective:   HPI: Patient is a 44 y.o. female here for left foot injury.  1/11: Patient reports she works at [employer]. Yesterday while at work she accidentally inverted her left ankle. Felt and heard a pop lateral left foot with a lot of pain. Associated swelling and bruising. No prior injuries. Went to ED where radiographs showed an avulsion fracture of base of 5th metatarsal. Placed in a posterior splint with crutches and referred here.  1/27: Patient reports she continues to struggle with pain at 8/10 level 5th metatarsal. Has been wearing cam walker. Not putting much pressure on outside of foot in the boot through. Taking pain medication as prescribed by her pain clinic.  3/3: Patient reports she's continued to struggle with pain in her left foot. Using cam walker. Feels like this is making her low back and right knee flare up - has history of chronic low back pain and right knee pain in the chart.  4/7: Patient returns with 4/10 level of pain in left foot. Has recently started physical therapy for this and her low back (compensatory pain related to wearing cam walker). Still cannot wear regular shoes due to pain.  Past Medical History  Diagnosis Date  . Sciatica   . Carpal tunnel syndrome   . Cervical polyp   . Hepatitis C   . Hypertension   . Chronic lower back pain   . Chronic leg pain     Current Outpatient Prescriptions on File Prior to Visit  Medication Sig Dispense Refill  . gabapentin (NEURONTIN) 300 MG capsule   0  . ibuprofen (ADVIL,MOTRIN) 800 MG tablet Take 1 tablet (800 mg total) by mouth 3 (three) times daily. 21 tablet 0  . meclizine (ANTIVERT) 25 MG tablet Take 1 tablet (25 mg total) by mouth 3 (three) times daily as needed for dizziness. 30 tablet 0  . oxyCODONE (ROXICODONE) 15 MG immediate release tablet Take 15 mg by mouth every 4 (four) hours as needed for pain.     No current facility-administered medications on  file prior to visit.    Past Surgical History  Procedure Laterality Date  . Hernia repair    . Tubal ligation    . Breast enhancement surgery    . Carpal tunnel release      Allergies  Allergen Reactions  . Penicillins Hives and Rash  . Sulfa Antibiotics Hives, Itching and Rash  . Darvocet [Propoxyphene N-Acetaminophen]     GI Upset  . Anesthetics, Halogenated Nausea Only  . Hydrocodone Nausea Only    History   Social History  . Marital Status: Married    Spouse Name: N/A  . Number of Children: N/A  . Years of Education: N/A   Occupational History  . Not on file.   Social History Main Topics  . Smoking status: Former Smoker -- 0.50 packs/day  . Smokeless tobacco: Not on file  . Alcohol Use: No  . Drug Use: No  . Sexual Activity: Not on file   Other Topics Concern  . Not on file   Social History Narrative    Family History  Problem Relation Age of Onset  . Heart disease Mother   . COPD Mother   . Cancer Father     BP 155/104 mmHg  Pulse 74  Ht 5\' 2"  (1.575 m)  Wt 206 lb (93.441 kg)  BMI 37.67 kg/m2  LMP 04/26/2014  Review of Systems: See HPI above.    Objective:  Physical Exam:  Gen: NAD  Left foot/ankle: No swelling, bruising lateral foot.  No other deformity. Mod limitation all motions of ankle. TTP mid-portion of 5th metatarsal only.  No other tenderness.   Negative ant drawer and talar tilt.   Negative syndesmotic compression. Thompsons test negative. NV intact distally.    Assessment & Plan:  1. Left 5th metatarsal avulsion fracture - Repeat radiographs show excellent healing of fracture.  Tenderness is 2/2 bruising and stiffness though no issues with this moving forward - should resolve with physical therapy over the next few weeks.  Having issues with low back due to wearing cam walker - should also improve with physical therapy.  Given degree of pain and that she has only just started therapy think she is not yet ready to return to  work but should be able to at least return to light duty at next follow-up in 4 weeks.

## 2014-05-15 NOTE — Assessment & Plan Note (Signed)
Left 5th metatarsal avulsion fracture - Repeat radiographs show excellent healing of fracture.  Tenderness is 2/2 bruising and stiffness though no issues with this moving forward - should resolve with physical therapy over the next few weeks.  Having issues with low back due to wearing cam walker - should also improve with physical therapy.  Given degree of pain and that she has only just started therapy think she is not yet ready to return to work but should be able to at least return to light duty at next follow-up in 4 weeks.

## 2014-06-07 ENCOUNTER — Encounter: Payer: Worker's Compensation | Admitting: Family Medicine

## 2014-06-14 ENCOUNTER — Ambulatory Visit (INDEPENDENT_AMBULATORY_CARE_PROVIDER_SITE_OTHER): Payer: Worker's Compensation | Admitting: Family Medicine

## 2014-06-14 ENCOUNTER — Encounter: Payer: Self-pay | Admitting: Family Medicine

## 2014-06-14 VITALS — BP 173/100 | HR 76 | Ht 62.0 in | Wt 195.0 lb

## 2014-06-14 DIAGNOSIS — S92355D Nondisplaced fracture of fifth metatarsal bone, left foot, subsequent encounter for fracture with routine healing: Secondary | ICD-10-CM

## 2014-06-14 NOTE — Patient Instructions (Signed)
See note for return to work in 2 weeks. Continue physical therapy through that time. Follow up with me in 6 weeks.

## 2014-06-19 DIAGNOSIS — I10 Essential (primary) hypertension: Secondary | ICD-10-CM | POA: Insufficient documentation

## 2014-06-20 NOTE — Assessment & Plan Note (Signed)
Left 5th metatarsal avulsion fracture - Repeat radiographs showed excellent healing of fracture at last visit.  I advised she continue with her physical therapy for the next 2 weeks then would return to full duty at that time.  Will plan to see her back in 6 weeks for likely final visit.

## 2014-06-20 NOTE — Progress Notes (Signed)
PCP: Karamalegos, AlSaralyn Pilarexander, DO  Subjective:   HPI: Patient is a 44 y.o. female here for left foot injury.  1/11: Patient reports she works at [employer]. Yesterday while at work she accidentally inverted her left ankle. Felt and heard a pop lateral left foot with a lot of pain. Associated swelling and bruising. No prior injuries. Went to ED where radiographs showed an avulsion fracture of base of 5th metatarsal. Placed in a posterior splint with crutches and referred here.  1/27: Patient reports she continues to struggle with pain at 8/10 level 5th metatarsal. Has been wearing cam walker. Not putting much pressure on outside of foot in the boot through. Taking pain medication as prescribed by her pain clinic.  3/3: Patient reports she's continued to struggle with pain in her left foot. Using cam walker. Feels like this is making her low back and right knee flare up - has history of chronic low back pain and right knee pain in the chart.  4/7: Patient returns with 4/10 level of pain in left foot. Has recently started physical therapy for this and her low back (compensatory pain related to wearing cam walker). Still cannot wear regular shoes due to pain.  5/12: Patient reports her pain is down to 0/10 now. Doing well with physical therapy - has 2 more weeks scheduled. Able to use treadmill without any problems.  Past Medical History  Diagnosis Date  . Sciatica   . Carpal tunnel syndrome   . Cervical polyp   . Hepatitis C   . Hypertension   . Chronic lower back pain   . Chronic leg pain     Current Outpatient Prescriptions on File Prior to Visit  Medication Sig Dispense Refill  . gabapentin (NEURONTIN) 300 MG capsule   0  . ibuprofen (ADVIL,MOTRIN) 800 MG tablet Take 1 tablet (800 mg total) by mouth 3 (three) times daily. 21 tablet 0  . meclizine (ANTIVERT) 25 MG tablet Take 1 tablet (25 mg total) by mouth 3 (three) times daily as needed for dizziness. 30 tablet 0  .  oxyCODONE (ROXICODONE) 15 MG immediate release tablet Take 15 mg by mouth every 4 (four) hours as needed for pain.     No current facility-administered medications on file prior to visit.    Past Surgical History  Procedure Laterality Date  . Hernia repair    . Tubal ligation    . Breast enhancement surgery    . Carpal tunnel release      Allergies  Allergen Reactions  . Penicillins Hives and Rash  . Sulfa Antibiotics Hives, Itching and Rash  . Darvocet [Propoxyphene N-Acetaminophen]     GI Upset  . Anesthetics, Halogenated Nausea Only  . Hydrocodone Nausea Only    History   Social History  . Marital Status: Married    Spouse Name: N/A  . Number of Children: N/A  . Years of Education: N/A   Occupational History  . Not on file.   Social History Main Topics  . Smoking status: Former Smoker -- 0.50 packs/day  . Smokeless tobacco: Not on file  . Alcohol Use: No  . Drug Use: No  . Sexual Activity: Not on file   Other Topics Concern  . Not on file   Social History Narrative    Family History  Problem Relation Age of Onset  . Heart disease Mother   . COPD Mother   . Cancer Father     BP 173/100 mmHg  Pulse 76  Ht  5\' 2"  (1.575 m)  Wt 195 lb (88.451 kg)  BMI 35.66 kg/m2  Review of Systems: See HPI above.    Objective:  Physical Exam:  Gen: NAD  Left foot/ankle: No swelling, bruising lateral foot.  No other deformity. FROM. No TTP 5th metatarsal.  No other tenderness.   Negative ant drawer and talar tilt.   Negative syndesmotic compression. Thompsons test negative. NV intact distally.    Assessment & Plan:  1. Left 5th metatarsal avulsion fracture - Repeat radiographs showed excellent healing of fracture at last visit.  I advised she continue with her physical therapy for the next 2 weeks then would return to full duty at that time.  Will plan to see her back in 6 weeks for likely final visit.

## 2014-07-09 ENCOUNTER — Ambulatory Visit (INDEPENDENT_AMBULATORY_CARE_PROVIDER_SITE_OTHER): Payer: Worker's Compensation | Admitting: Family Medicine

## 2014-07-09 ENCOUNTER — Encounter: Payer: Self-pay | Admitting: Family Medicine

## 2014-07-09 ENCOUNTER — Encounter (INDEPENDENT_AMBULATORY_CARE_PROVIDER_SITE_OTHER): Payer: Self-pay

## 2014-07-09 VITALS — BP 145/97 | HR 93 | Ht 62.0 in | Wt 207.0 lb

## 2014-07-09 DIAGNOSIS — G8929 Other chronic pain: Secondary | ICD-10-CM

## 2014-07-09 DIAGNOSIS — M549 Dorsalgia, unspecified: Secondary | ICD-10-CM

## 2014-07-09 DIAGNOSIS — M545 Low back pain, unspecified: Secondary | ICD-10-CM

## 2014-07-09 MED ORDER — METHOCARBAMOL 500 MG PO TABS: 500.0000 mg | ORAL_TABLET | Freq: Three times a day (TID) | ORAL | Status: AC | PRN

## 2014-07-09 MED ORDER — DICLOFENAC SODIUM 75 MG PO TBEC: 75.0000 mg | DELAYED_RELEASE_TABLET | Freq: Two times a day (BID) | ORAL | Status: AC

## 2014-07-09 NOTE — Patient Instructions (Signed)
We will go ahead with an MRI to assess for a new disc herniation of your back. Call us if you want to try prednisone. Diclofenac twice a day with food for pain and inflammation. Robaxin as needed for muscle spasms (no driving on this medicine if it makes you sleepy). Stay as active as possible. Do home exercises as shown in the handout. Strengthening of low back muscles, abdominal musculature are key for long term pain relief. Follow up will depend on your MRI results.

## 2014-07-10 NOTE — Assessment & Plan Note (Signed)
acute on chronic, may be related to increased lift of cam walker she used for 5th metatarsal fracture that has now healed.  Discussed options - declined prednisone but will try diclofenac with robaxin as needed.  Takes oxycodone chronically.  Will repeat her MRI of lumbar spine and consider repeating PT specifically for this, ESI, or neurosurgery referral depending on results.

## 2014-07-10 NOTE — Progress Notes (Addendum)
PCP: Saralyn PilarAlexander Karamalegos, DO  Subjective:   HPI: Patient is a 44 y.o. female here for low back pain.  Patient returns with worsening low back pain she believes may have been related to the cam walker she used for her fifth metatarsal fracture Has history of chronic back pain though full records not available - sees pain management and is on oxycodone. Last MRI in system is from March 2014 showing mild DDD at L4-5 with mild foraminal narrowing more on the left.  Has history of L1 compression fracture as well. She has done physical therapy in the past though they currently are mostly working on her foot/ankle and right knee. Burning sensation mainly in her back. Worse with bending, twisting. No bowel/bladder dysfunction.  Past Medical History  Diagnosis Date  . Sciatica   . Carpal tunnel syndrome   . Cervical polyp   . Hepatitis C   . Hypertension   . Chronic lower back pain   . Chronic leg pain     Current Outpatient Prescriptions on File Prior to Visit  Medication Sig Dispense Refill  . amLODipine (NORVASC) 5 MG tablet   1  . fluticasone (FLONASE) 50 MCG/ACT nasal spray   0  . gabapentin (NEURONTIN) 300 MG capsule   0  . meclizine (ANTIVERT) 25 MG tablet Take 1 tablet (25 mg total) by mouth 3 (three) times daily as needed for dizziness. 30 tablet 0  . oxyCODONE (ROXICODONE) 15 MG immediate release tablet Take 15 mg by mouth every 4 (four) hours as needed for pain.     No current facility-administered medications on file prior to visit.    Past Surgical History  Procedure Laterality Date  . Hernia repair    . Tubal ligation    . Breast enhancement surgery    . Carpal tunnel release      Allergies  Allergen Reactions  . Penicillins Hives and Rash  . Sulfa Antibiotics Hives, Itching and Rash  . Darvocet [Propoxyphene N-Acetaminophen]     GI Upset  . Anesthetics, Halogenated Nausea Only  . Hydrocodone Nausea Only    History   Social History  . Marital Status:  Married    Spouse Name: N/A  . Number of Children: N/A  . Years of Education: N/A   Occupational History  . Not on file.   Social History Main Topics  . Smoking status: Former Smoker -- 0.50 packs/day  . Smokeless tobacco: Not on file  . Alcohol Use: No  . Drug Use: No  . Sexual Activity: Not on file   Other Topics Concern  . Not on file   Social History Narrative    Family History  Problem Relation Age of Onset  . Heart disease Mother   . COPD Mother   . Cancer Father     BP 145/97 mmHg  Pulse 93  Ht 5\' 2"  (1.575 m)  Wt 207 lb (93.895 kg)  BMI 37.85 kg/m2  Review of Systems: See HPI above.    Objective:  Physical Exam:  Gen: NAD  Back: No gross deformity, scoliosis. TTP L > R paraspinal lumbar regions.  No midline or bony TTP. Flexion only to 30 degrees, extension to 10 degrees. Strength LEs 5/5 all muscle groups.   2+ MSRs in patellar and achilles tendons, equal bilaterally. Negative SLRs. Sensation intact to light touch bilaterally. Negative logroll bilateral hips    Assessment & Plan:  1. Low back pain - acute on chronic, may be related to increased lift  of cam walker she used for 5th metatarsal fracture that has now healed.  Discussed options - declined prednisone but will try diclofenac with robaxin as needed.  Takes oxycodone chronically.  Will repeat her MRI of lumbar spine and consider repeating PT specifically for this, ESI, or neurosurgery referral depending on results.  Addendum:  MRI of lumbar spine reviewed and discussed with patient.  No changes compared to MRI from 2 years ago.  Advised she continue with physical therapy over the next 6 weeks for lumbar spasms/strain and follow-up with me at that time.

## 2014-07-12 ENCOUNTER — Ambulatory Visit: Payer: Self-pay | Admitting: Family Medicine

## 2014-07-26 ENCOUNTER — Ambulatory Visit: Payer: Self-pay | Admitting: Family Medicine

## 2014-09-06 ENCOUNTER — Encounter: Payer: Self-pay | Admitting: Family Medicine

## 2014-09-12 ENCOUNTER — Ambulatory Visit: Payer: Worker's Compensation | Admitting: Family Medicine

## 2014-10-23 ENCOUNTER — Encounter: Payer: Self-pay | Admitting: Family Medicine

## 2014-10-23 ENCOUNTER — Ambulatory Visit (INDEPENDENT_AMBULATORY_CARE_PROVIDER_SITE_OTHER): Payer: Worker's Compensation | Admitting: Family Medicine

## 2014-10-23 VITALS — BP 149/102 | HR 108 | Ht 63.0 in | Wt 197.0 lb

## 2014-10-23 DIAGNOSIS — G8929 Other chronic pain: Secondary | ICD-10-CM

## 2014-10-23 DIAGNOSIS — M549 Dorsalgia, unspecified: Secondary | ICD-10-CM | POA: Diagnosis not present

## 2014-10-23 NOTE — Patient Instructions (Signed)
As we discussed I don't think there's anything further to offer for your foot and back pain. The fracture has healed on x-rays and as we discussed on the phone following the MRI there's nothing here that has changed from the prior MRI to warrant more than 6 weeks of physical therapy for lumbar strain related to wearing the boot. Follow up with me as needed.

## 2014-10-25 NOTE — Assessment & Plan Note (Signed)
acute on chronic, may be related to increased lift of cam walker she used for 5th metatarsal fracture that has now healed.  However, MRI does not show any findings to account for persistent pain that would be related to her accident or treatment from her accident.  Any compensatory pain, lumbar strain should be healed at this point.  Takes oxycodone chronically for back pain.  She has missed PT visits due to financial reasons per patient - cannot afford car trips to PT.  Do not think there is anything further to offer her, is now dealing with her chronic low back pain.  Advised there is no chronic impairment as a result of her injury at work.  Would release her as MMI from her acute work injury at this time.

## 2014-10-25 NOTE — Progress Notes (Signed)
PCP: Saralyn Pilar, DO  Subjective:   HPI: Patient is a 44 y.o. female here for low back pain.  6/6: Patient returns with worsening low back pain she believes may have been related to the cam walker she used for her fifth metatarsal fracture Has history of chronic back pain though full records not available - sees pain management and is on oxycodone. Last MRI in system is from March 2014 showing mild DDD at L4-5 with mild foraminal narrowing more on the left.  Has history of L1 compression fracture as well. She has done physical therapy in the past though they currently are mostly working on her foot/ankle and right knee. Burning sensation mainly in her back. Worse with bending, twisting. No bowel/bladder dysfunction.  9/20: Patient returns for routine follow-up. She reports she's had only one good week between last visit and today's visit. Deals with pain constantly - recently was in [employer] and had to stop and bend over to rest because of back pain. Has burning sensation in low back radiating to legs. Numbness into left foot. Worse with any motions. No bowel/bladder dysfunction. Difficulty sleeping. Pain level currently 4/10. MRI of lumbar spine was reviewed at end of July and showed no changes compared to one in June - has been doing physical therapy for lumbar strain, acute on chronic low back pain related to wearing cam walker - was instructed on phone to go ahead with PT for additional 6 weeks of PT and follow-up today to discuss MMI.  Past Medical History  Diagnosis Date  . Sciatica   . Carpal tunnel syndrome   . Cervical polyp   . Hepatitis C   . Hypertension   . Chronic lower back pain   . Chronic leg pain     Current Outpatient Prescriptions on File Prior to Visit  Medication Sig Dispense Refill  . amLODipine (NORVASC) 5 MG tablet   1  . busPIRone (BUSPAR) 10 MG tablet   1  . diclofenac (VOLTAREN) 75 MG EC tablet Take 1 tablet (75 mg total) by mouth  2 (two) times daily. 60 tablet 1  . fluticasone (FLONASE) 50 MCG/ACT nasal spray   0  . gabapentin (NEURONTIN) 300 MG capsule   0  . meclizine (ANTIVERT) 25 MG tablet Take 1 tablet (25 mg total) by mouth 3 (three) times daily as needed for dizziness. 30 tablet 0  . methocarbamol (ROBAXIN) 500 MG tablet Take 1 tablet (500 mg total) by mouth every 8 (eight) hours as needed for muscle spasms. 60 tablet 1  . oxyCODONE (ROXICODONE) 15 MG immediate release tablet Take 15 mg by mouth every 4 (four) hours as needed for pain.     No current facility-administered medications on file prior to visit.    Past Surgical History  Procedure Laterality Date  . Hernia repair    . Tubal ligation    . Breast enhancement surgery    . Carpal tunnel release      Allergies  Allergen Reactions  . Penicillins Hives and Rash  . Sulfa Antibiotics Hives, Itching and Rash  . Darvocet [Propoxyphene N-Acetaminophen]     GI Upset  . Anesthetics, Halogenated Nausea Only  . Hydrocodone Nausea Only    Social History   Social History  . Marital Status: Married    Spouse Name: N/A  . Number of Children: N/A  . Years of Education: N/A   Occupational History  . Not on file.   Social History Main Topics  .  Smoking status: Former Smoker -- 0.50 packs/day  . Smokeless tobacco: Not on file  . Alcohol Use: No  . Drug Use: No  . Sexual Activity: Not on file   Other Topics Concern  . Not on file   Social History Narrative    Family History  Problem Relation Age of Onset  . Heart disease Mother   . COPD Mother   . Cancer Father     BP 149/102 mmHg  Pulse 108  Ht  (1.6 m)  Wt 197 lb (89.359 kg)  BMI 34.91 kg/m2  Review of Systems: See HPI above.    Objective:  Physical Exam:  Gen: NAD  Back: No gross deformity, scoliosis. TTP L > R paraspinal lumbar regions.  No midline or bony TTP. Difficulty with any flexion, extension. Strength LEs 5/5 all muscle groups though decreased effort 2/2  pain.   Negative SLRs. Negative logroll bilateral hips    Assessment & Plan:  1. Low back pain - acute on chronic, may be related to increased lift of cam walker she used for 5th metatarsal fracture that has now healed.  However, MRI does not show any findings to account for persistent pain that would be related to her accident or treatment from her accident.  Any compensatory pain, lumbar strain should be healed at this point.  Takes oxycodone chronically for back pain.  She has missed PT visits due to financial reasons per patient - cannot afford car trips to PT.  Do not think there is anything further to offer her, is now dealing with her chronic low back pain.  Advised there is no chronic impairment as a result of her injury at work.  Would release her as MMI from her acute work injury at this time.

## 2014-11-16 ENCOUNTER — Ambulatory Visit: Payer: Worker's Compensation | Admitting: Family Medicine

## 2014-11-16 ENCOUNTER — Encounter: Payer: Self-pay | Admitting: Family Medicine

## 2014-11-16 NOTE — Progress Notes (Signed)
Patient ID: Kayla Cooper, female   DOB: 05-15-1970, 44 y.o.   MRN: [id number]  Patient wanted to make appointment today with us for workers comp injuries, stating her left foot and hip pain are severe and making it too difficult to do anything.  As we discussed previously she has reached maximum medical improvement from her work injury.  Foot fracture has healed, back/hip issues are chronic, unrelated to work injury.  We released her at last office visit.

## 2015-07-08 ENCOUNTER — Emergency Department (HOSPITAL_BASED_OUTPATIENT_CLINIC_OR_DEPARTMENT_OTHER)
Admission: EM | Admit: 2015-07-08 | Discharge: 2015-07-08 | Disposition: A | Discharge status: Home / Self Care | Payer: BLUE CROSS/BLUE SHIELD | Attending: Emergency Medicine | Admitting: Emergency Medicine

## 2015-07-08 ENCOUNTER — Encounter (HOSPITAL_BASED_OUTPATIENT_CLINIC_OR_DEPARTMENT_OTHER): Payer: Self-pay | Admitting: Emergency Medicine

## 2015-07-08 DIAGNOSIS — I10 Essential (primary) hypertension: Secondary | ICD-10-CM | POA: Insufficient documentation

## 2015-07-08 DIAGNOSIS — M25552 Pain in left hip: Secondary | ICD-10-CM | POA: Insufficient documentation

## 2015-07-08 DIAGNOSIS — Z87891 Personal history of nicotine dependence: Secondary | ICD-10-CM | POA: Insufficient documentation

## 2015-07-08 DIAGNOSIS — Z79899 Other long term (current) drug therapy: Secondary | ICD-10-CM | POA: Diagnosis not present

## 2015-07-08 MED ORDER — TRAMADOL HCL 50 MG PO TABS
100.0000 mg | ORAL_TABLET | Freq: Once | ORAL | Status: AC
  Administered 2015-07-08: 100 mg via ORAL
  Filled 2015-07-08: qty 2

## 2015-07-08 MED ORDER — IBUPROFEN 800 MG PO TABS: 800.0000 mg | ORAL_TABLET | Freq: Three times a day (TID) | ORAL | Status: AC

## 2015-07-08 MED ORDER — OXYCODONE HCL 5 MG PO TABS: 5.0000 mg | ORAL_TABLET | ORAL | Status: AC | PRN

## 2015-07-08 MED ORDER — KETOROLAC TROMETHAMINE 60 MG/2ML IM SOLN
60.0000 mg | Freq: Once | INTRAMUSCULAR | Status: AC
  Administered 2015-07-08: 60 mg via INTRAMUSCULAR
  Filled 2015-07-08: qty 2

## 2015-07-08 NOTE — ED Notes (Signed)
Patient has had pain to her left hip x 2 -3 weeks. The patient reports that she has had been on pain meds x 2 weeks for this problem from her PCP - the patinet reports that she had a cortisone shot on Wed, and the patient reports that she continues to have pain to her left hip

## 2015-07-08 NOTE — ED Provider Notes (Signed)
CSN: [id number]     Arrival date & time 07/08/15  2051 History  By signing my name below, I, Bethel Born, attest that this documentation has been prepared under the direction and in the presence of Marily Memos, MD. Electronically Signed: Bethel Born, ED Scribe. 07/08/2015. 9:53 PM    Chief Complaint  Patient presents with  . Hip Pain   The history is provided by the patient. No language interpreter was used.   ZO LOUDON is a 45 y.o. female with PMHx of sciatica and chronic back pain who presents to the Emergency Department complaining of constant, 10/10 in severity, left hip pain with onset 3 weeks ago. Her pain is exacerbated by standing for long periods at work. The pain radiates down the leg to the knee.  She was seen at  [facility] in [place] 2 weeks ago where she had XRs of the hip and knee and was diagnosed with arthritis. She was given a cortisone shot 5 days ago and had some temporary pain relief. Heat and 800 mg of Motrin QID has provided insufficient pain relief at home lately. She called her PCP and was referred to the ED for a short course of pain medication until she can be seen next week. Pt denies difficulty urinating, difficulty passing stool, and dysuria.   Past Medical History  Diagnosis Date  . Sciatica   . Carpal tunnel syndrome   . Cervical polyp   . Hepatitis C   . Hypertension   . Chronic lower back pain   . Chronic leg pain    Past Surgical History  Procedure Laterality Date  . Hernia repair    . Tubal ligation    . Breast enhancement surgery    . Carpal tunnel release     Family History  Problem Relation Age of Onset  . Heart disease Mother   . COPD Mother   . Cancer Father    Social History  Substance Use Topics  . Smoking status: Former Smoker -- 0.00 packs/day  . Smokeless tobacco: None  . Alcohol Use: No   OB History    No data available     Review of Systems  Gastrointestinal: Negative for diarrhea and constipation.   Genitourinary: Negative for dysuria and difficulty urinating.  Musculoskeletal: Positive for arthralgias (left hip).  All other systems reviewed and are negative.  Allergies  Penicillins; Sulfa antibiotics; Darvocet; Anesthetics, halogenated; and Hydrocodone  Home Medications   Prior to Admission medications   Medication Sig Start Date End Date Taking? Authorizing Provider  amLODipine (NORVASC) 5 MG tablet  06/19/14   Historical Provider, MD  busPIRone (BUSPAR) 10 MG tablet  06/21/14   Historical Provider, MD  cloNIDine (CATAPRES - DOSED IN MG/24 HR) 0.1 mg/24hr patch APPLY ONE PATCH WEEKLY 09/21/14   Historical Provider, MD  diclofenac (VOLTAREN) 75 MG EC tablet Take 1 tablet (75 mg total) by mouth 2 (two) times daily. 07/09/14   Lenda Kelp, MD  fluticasone Aleda Grana) 50 MCG/ACT nasal spray  06/19/14   Historical Provider, MD  gabapentin (NEURONTIN) 300 MG capsule  01/22/14   Historical Provider, MD  ibuprofen (ADVIL,MOTRIN) 800 MG tablet Take 1 tablet (800 mg total) by mouth 3 (three) times daily. 07/08/15   Marily Memos, MD  meclizine (ANTIVERT) 25 MG tablet Take 1 tablet (25 mg total) by mouth 3 (three) times daily as needed for dizziness. 01/17/14   Mirian Mo, MD  methocarbamol (ROBAXIN) 500 MG tablet Take 1 tablet (500 mg total)  by mouth every 8 (eight) hours as needed for muscle spasms. 07/09/14   Lenda KelpShane R Hudnall, MD  oxyCODONE (ROXICODONE) 5 MG immediate release tablet Take 1 tablet (5 mg total) by mouth every 4 (four) hours as needed for severe pain. 07/08/15   Marily MemosJason Tayden Duran, MD   BP 158/91 mmHg  Pulse 88  Temp(Src) 98.1 F (36.7 C) (Oral)  Resp 18  Ht 5\' 2"  (1.575 m)  Wt 180 lb (81.647 kg)  BMI 32.91 kg/m2  SpO2 100%  LMP 07/02/2015 Physical Exam  Constitutional: She appears well-developed and well-nourished.  HENT:  Head: Normocephalic and atraumatic.  Neck: Normal range of motion.  Cardiovascular: Normal rate and regular rhythm.   Pulmonary/Chest: No stridor. No  respiratory distress.  Abdominal: She exhibits no distension.  No CVA tenderness  Musculoskeletal:  No midline spinal tenderness RLE: Tenderness over left prox femur No pain with ROM of hip or knee  Neurological: She is alert.  Skin: No rash noted.  No obvious rash  Nursing note and vitals reviewed.   ED Course  Procedures (including critical care time) DIAGNOSTIC STUDIES: Oxygen Saturation is 100% on RA,  normal by my interpretation.    COORDINATION OF CARE: 9:45 PM Discussed treatment plan which includes pain medication with pt at bedside and pt agreed to plan. Labs Review Labs Reviewed - No data to display  Imaging Review No results found.   EKG Interpretation None      MDM   Final diagnoses:  Hip pain, left    Acute exacerbation of chronic hip pain. Exam benign. Doubt new injury. Was sent here to work on pain control. Her story matches the records from [facility] in [place] and from [place] controlled substance database. Short course of opioids given.   New Prescriptions: Discharge Medication List as of 07/08/2015 10:03 PM    START taking these medications   Details  ibuprofen (ADVIL,MOTRIN) 800 MG tablet Take 1 tablet (800 mg total) by mouth 3 (three) times daily., Starting 07/08/2015, Until Discontinued, Print         I have personally and contemperaneously reviewed labs and imaging and used in my decision making as above.   A medical screening exam was performed and I feel the patient has had an appropriate workup for their chief complaint at this time and likelihood of emergent condition existing is low and thus workup can continue on an outpatient basis.. Their vital signs are stable. They have been counseled on decision, discharge, follow up and which symptoms necessitate immediate return to the emergency department.  They verbally stated understanding and agreement with plan and discharged in stable condition.   I personally performed the services described in this  documentation, which was scribed in my presence. The recorded information has been reviewed and is accurate.   Marily MemosJason Kiegan Macaraeg, MD 07/09/15 [phone]

## 2015-07-08 NOTE — ED Notes (Signed)
Pt requesting to speak with md, stating she would like xrays.

## 2015-09-03 HISTORY — PX: TOTAL HIP ARTHROPLASTY: SHX124

## 2015-12-12 IMAGING — DX DG FOOT COMPLETE 3+V*L*
3 series · 3 of 3 positions shown · non-contrast
Comparison: Left foot films of 02/28/2014 and 02/11/2014

CLINICAL DATA: History of fracture of the base of the fifth
metatarsal

EXAM:
LEFT FOOT - COMPLETE 3+ VIEW

[foot ap]
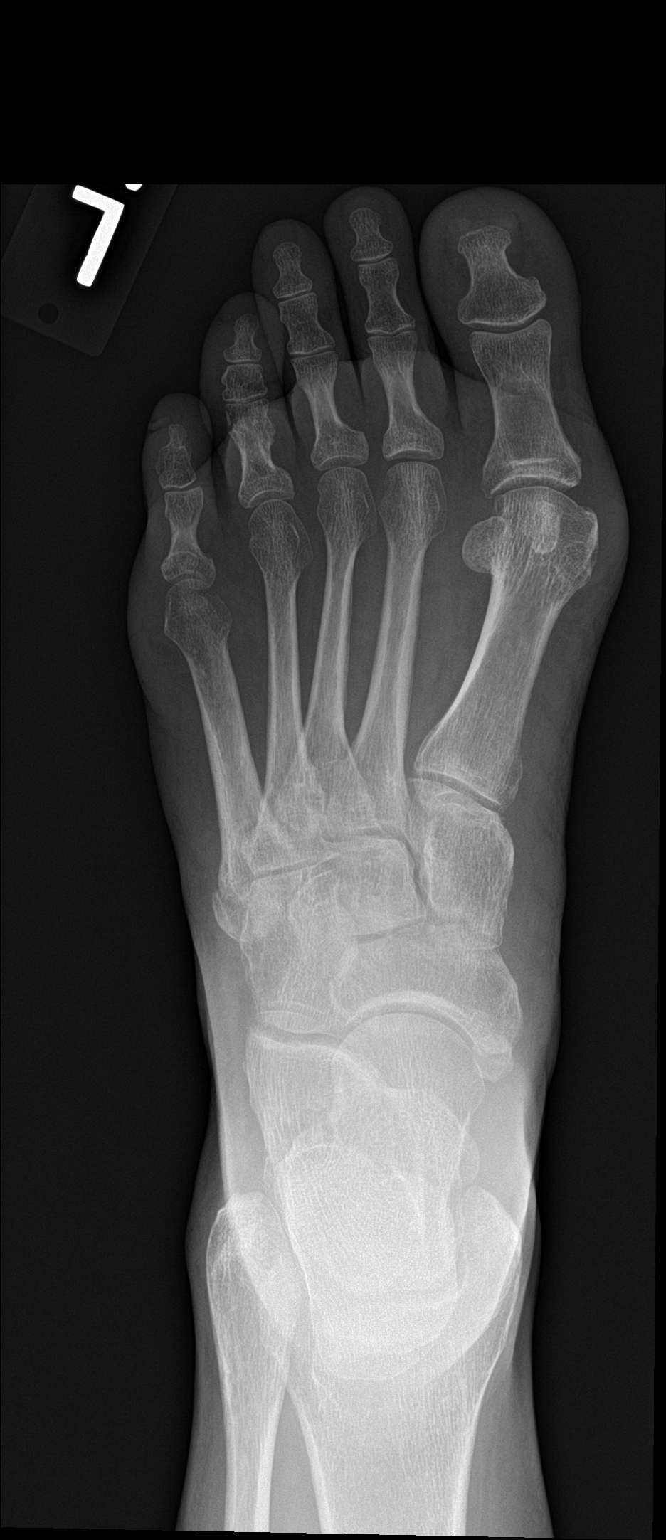

[foot obl]
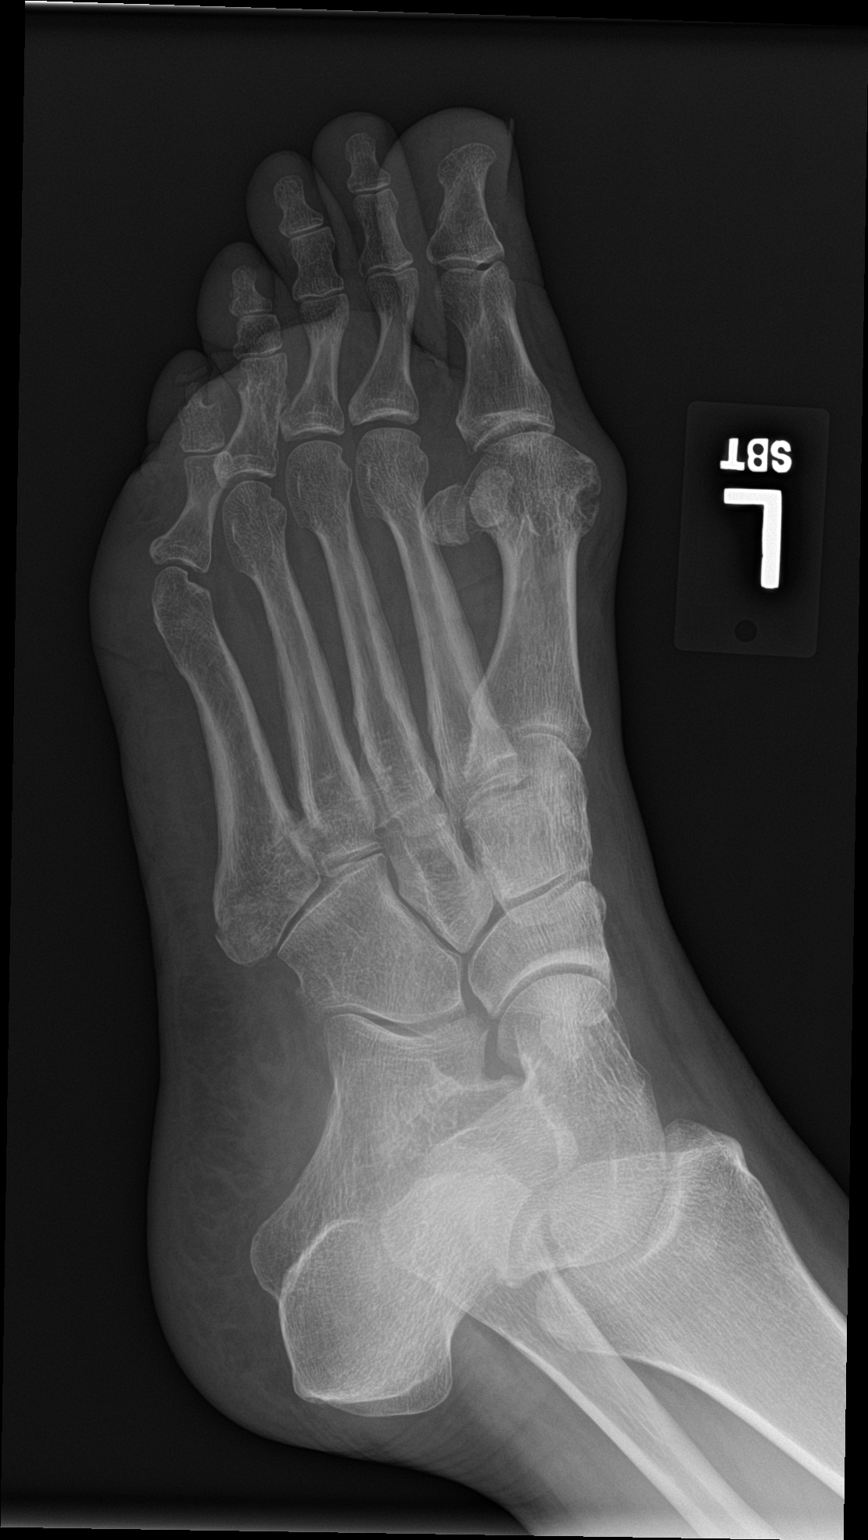

[foot lat]
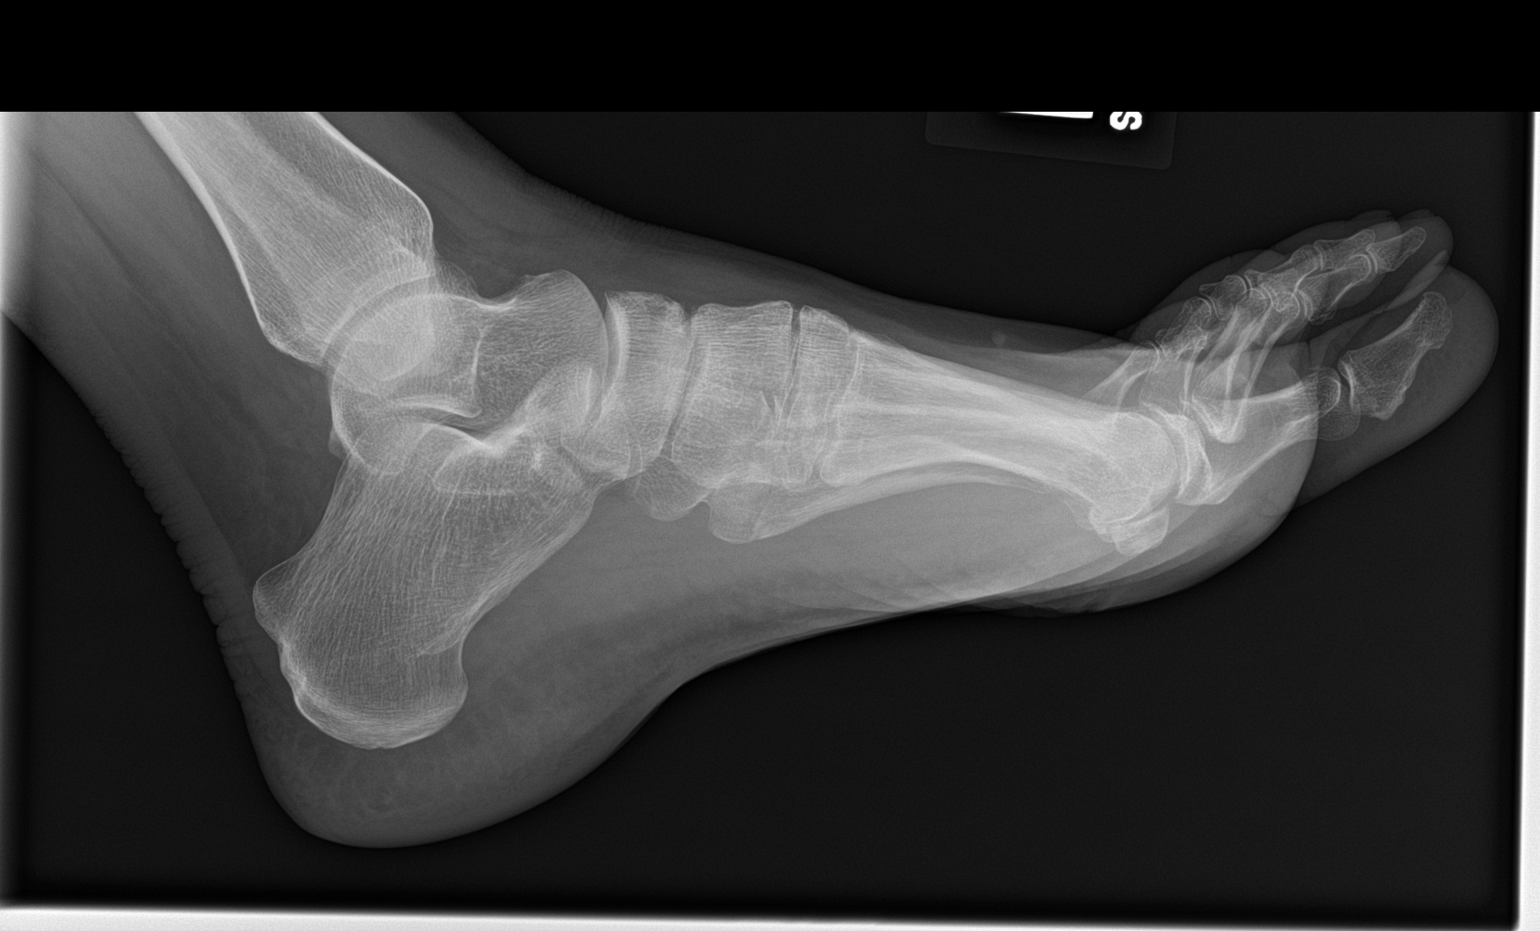

[3 of 3 positions shown; findings below may reference images not displayed]

FINDINGS: The fracture line through the base of the left fifth metatarsal is
less visible indicating some interval healing. No displacement of
the fracture is seen. Otherwise tarsal metatarsal alignment is
normal. There is degenerative change involving the left first MTP
joint. No erosion is seen.
IMPRESSION: Fracture line through the base of the left fifth metatarsal less
less visible.

## 2016-04-25 ENCOUNTER — Encounter (HOSPITAL_BASED_OUTPATIENT_CLINIC_OR_DEPARTMENT_OTHER): Payer: Self-pay | Admitting: Emergency Medicine

## 2016-04-25 ENCOUNTER — Emergency Department (HOSPITAL_BASED_OUTPATIENT_CLINIC_OR_DEPARTMENT_OTHER): Payer: BLUE CROSS/BLUE SHIELD

## 2016-04-25 ENCOUNTER — Emergency Department (HOSPITAL_BASED_OUTPATIENT_CLINIC_OR_DEPARTMENT_OTHER)
Admission: EM | Admit: 2016-04-25 | Discharge: 2016-04-25 | Disposition: A | Discharge status: Home / Self Care | Payer: BLUE CROSS/BLUE SHIELD | Attending: Emergency Medicine | Admitting: Emergency Medicine

## 2016-04-25 DIAGNOSIS — Y929 Unspecified place or not applicable: Secondary | ICD-10-CM | POA: Insufficient documentation

## 2016-04-25 DIAGNOSIS — Z791 Long term (current) use of non-steroidal anti-inflammatories (NSAID): Secondary | ICD-10-CM | POA: Insufficient documentation

## 2016-04-25 DIAGNOSIS — W010XXA Fall on same level from slipping, tripping and stumbling without subsequent striking against object, initial encounter: Secondary | ICD-10-CM | POA: Insufficient documentation

## 2016-04-25 DIAGNOSIS — Y939 Activity, unspecified: Secondary | ICD-10-CM | POA: Insufficient documentation

## 2016-04-25 DIAGNOSIS — Z87891 Personal history of nicotine dependence: Secondary | ICD-10-CM | POA: Insufficient documentation

## 2016-04-25 DIAGNOSIS — Y990 Civilian activity done for income or pay: Secondary | ICD-10-CM | POA: Insufficient documentation

## 2016-04-25 DIAGNOSIS — S39012A Strain of muscle, fascia and tendon of lower back, initial encounter: Secondary | ICD-10-CM | POA: Insufficient documentation

## 2016-04-25 DIAGNOSIS — S8002XA Contusion of left knee, initial encounter: Secondary | ICD-10-CM | POA: Insufficient documentation

## 2016-04-25 DIAGNOSIS — I10 Essential (primary) hypertension: Secondary | ICD-10-CM | POA: Insufficient documentation

## 2016-04-25 MED ORDER — TRAMADOL HCL 50 MG PO TABS: 50.0000 mg | ORAL_TABLET | Freq: Four times a day (QID) | ORAL | 0 refills | Status: AC | PRN

## 2016-04-25 NOTE — ED Notes (Signed)
Pt given d/c instructions as per chart. Rx x 1 with precautions. Verbalizes understanding. No questions. 

## 2016-04-25 NOTE — ED Triage Notes (Signed)
Pt slipped on wet floor injuring left knee and lower back.

## 2016-04-25 NOTE — ED Provider Notes (Signed)
[facility] Provider Note   CSN: [id number] Arrival date & time: 04/25/16  1643  By signing my name below, I, Kayla Cooper, attest that this documentation has been prepared under the direction and in the presence of physician practitioner, Geoffery Lyons, MD. Electronically Signed: Linna Cooper, Scribe. 04/25/2016. 5:15 PM.  History   Chief Complaint Chief Complaint  Patient presents with  . Knee Injury  . Back Pain    The history is provided by the patient. No language interpreter was used.     HPI Comments: Kayla Cooper is a 46 y.o. female with PMHx including chronic leg pain, chronic lower back pain, and sciatica who presents to the Emergency Department complaining of constant left knee and lower back pain beginning 2 days ago. She states she slipped on a wet floor at work and landed directly on her left knee. No head trauma or LOC. She has applied ice to her left knee and taken ibuprofen with no improvement of her pain. Pt reports left knee pain exacerbation with flexion and extension of her left knee. She reports back pain exacerbation with applied pressure to her lower back. She is ambulatory with a limp secondary to pain. Pt has a PSHx of total left hip replacement due to osteoarthritis with no post-operative complications. No h/o right hip problems. She denies joint swelling, numbness/tingling, or any other associated symptoms.  Past Medical History:  Diagnosis Date  . Carpal tunnel syndrome   . Cervical polyp   . Chronic leg pain   . Chronic lower back pain   . Hepatitis C   . Hypertension   . Sciatica     Patient Active Problem List   Diagnosis Date Noted  . Essential (primary) hypertension 06/19/2014  . Nondisplaced fracture of fifth left metatarsal bone with routine healing 02/14/2014  . Gonalgia 01/21/2012  . Back pain, chronic 01/05/2012  . Pain in left buttock 09/15/2011  . Right arm pain 09/15/2011  . Hepatitis C 09/15/2011    Past Surgical  History:  Procedure Laterality Date  . BREAST ENHANCEMENT SURGERY    . CARPAL TUNNEL RELEASE    . HERNIA REPAIR    . TOTAL HIP ARTHROPLASTY Left 09/2015  . TUBAL LIGATION      OB History    No data available       Home Medications    Prior to Admission medications   Medication Sig Start Date End Date Taking? Authorizing Provider  ibuprofen (ADVIL,MOTRIN) 800 MG tablet Take 1 tablet (800 mg total) by mouth 3 (three) times daily. 07/08/15  Yes Marily Memos, MD  amLODipine (NORVASC) 5 MG tablet  06/19/14   Historical Provider, MD  busPIRone (BUSPAR) 10 MG tablet  06/21/14   Historical Provider, MD  cloNIDine (CATAPRES - DOSED IN MG/24 HR) 0.1 mg/24hr patch APPLY ONE PATCH WEEKLY 09/21/14   Historical Provider, MD  diclofenac (VOLTAREN) 75 MG EC tablet Take 1 tablet (75 mg total) by mouth 2 (two) times daily. 07/09/14   Lenda Kelp, MD  fluticasone Aleda Grana) 50 MCG/ACT nasal spray  06/19/14   Historical Provider, MD  gabapentin (NEURONTIN) 300 MG capsule  01/22/14   Historical Provider, MD  meclizine (ANTIVERT) 25 MG tablet Take 1 tablet (25 mg total) by mouth 3 (three) times daily as needed for dizziness. 01/17/14   Mirian Mo, MD  methocarbamol (ROBAXIN) 500 MG tablet Take 1 tablet (500 mg total) by mouth every 8 (eight) hours as needed for muscle spasms. 07/09/14  Lenda KelpShane R Hudnall, MD  oxyCODONE (ROXICODONE) 5 MG immediate release tablet Take 1 tablet (5 mg total) by mouth every 4 (four) hours as needed for severe pain. 07/08/15   Marily MemosJason Mesner, MD    Family History Family History  Problem Relation Age of Onset  . Heart disease Mother   . COPD Mother   . Cancer Father     Social History Social History  Substance Use Topics  . Smoking status: Former Smoker    Packs/day: 0.00  . Smokeless tobacco: Never Used  . Alcohol use No     Allergies   Penicillins; Sulfa antibiotics; Darvocet [propoxyphene n-acetaminophen]; Anesthetics, halogenated; and Hydrocodone   Review of  Systems Review of Systems  Musculoskeletal: Positive for arthralgias, back pain and gait problem. Negative for joint swelling.  Neurological: Negative for syncope and numbness.  All other systems reviewed and are negative.  Physical Exam Updated Vital Signs BP (!) 159/103 (BP Location: Left Arm)   Pulse 72   Temp 98.4 F (36.9 C) (Oral)   Resp 19   Ht 5\' 2"  (1.575 m)   Wt 169 lb 12.8 oz (77 kg)   LMP 04/15/2016 (Exact Date)   SpO2 99%   BMI 31.06 kg/m   Physical Exam  Constitutional: She is oriented to person, place, and time. She appears well-developed and well-nourished.  HENT:  Head: Normocephalic.  Eyes: EOM are normal.  Neck: Normal range of motion.  Pulmonary/Chest: Effort normal.  Abdominal: She exhibits no distension.  Musculoskeletal: Normal range of motion. She exhibits tenderness.  The left knee appears grossly normal. Mild tenderness over the patella. No effusion or crepitus. Stable AP/laterally.  TTP in the soft tissues of the lumbar region. No bony tenderness or step-off.   Neurological: She is alert and oriented to person, place, and time.  Strength is 5/5 in both lower extremities. Reflexes are 1+ and symmetrical in both legs. Able to ambulate without difficulty.  Psychiatric: She has a normal mood and affect.  Nursing note and vitals reviewed.  ED Treatments / Results  Labs (all labs ordered are listed, but only abnormal results are displayed) Labs Reviewed - No data to display  EKG  EKG Interpretation None       Radiology No results found.  Procedures Procedures (including critical care time)  DIAGNOSTIC STUDIES: Oxygen Saturation is 99% on RA, normal by my interpretation.    COORDINATION OF CARE: 5:21 PM Discussed treatment plan with pt at bedside and pt agreed to plan.  Medications Ordered in ED Medications - No data to display   Initial Impression / Assessment and Plan / ED Course  I have reviewed the triage vital signs and the  nursing notes.  Pertinent labs & imaging results that were available during my care of the patient were reviewed by me and considered in my medical decision making (see chart for details).  X-rays are negative. I see nothing today that appears acute or emergent. She will be discharged with tramadol and follow-up with primary Dr. if not improving in the next week.  Final Clinical Impressions(s) / ED Diagnoses   Final diagnoses:  None    New Prescriptions New Prescriptions   No medications on file   I personally performed the services described in this documentation, which was scribed in my presence. The recorded information has been reviewed and is accurate.       Geoffery Lyonsouglas Disaya Walt, MD 04/25/16 [phone]

## 2016-04-25 NOTE — Discharge Instructions (Signed)
Tramadol as prescribed as needed for pain.  Follow-up with your primary Dr. if you're not improving in the next week.

## 2020-09-20 ENCOUNTER — Encounter (HOSPITAL_BASED_OUTPATIENT_CLINIC_OR_DEPARTMENT_OTHER): Payer: Self-pay | Admitting: *Deleted

## 2020-09-20 ENCOUNTER — Emergency Department (HOSPITAL_BASED_OUTPATIENT_CLINIC_OR_DEPARTMENT_OTHER): Payer: BC Managed Care – PPO

## 2020-09-20 ENCOUNTER — Emergency Department (HOSPITAL_BASED_OUTPATIENT_CLINIC_OR_DEPARTMENT_OTHER)
Admission: EM | Admit: 2020-09-20 | Discharge: 2020-09-20 | Disposition: A | Discharge status: Home / Self Care | Payer: BC Managed Care – PPO | Attending: Emergency Medicine | Admitting: Emergency Medicine

## 2020-09-20 ENCOUNTER — Other Ambulatory Visit: Payer: Self-pay

## 2020-09-20 DIAGNOSIS — R110 Nausea: Secondary | ICD-10-CM | POA: Diagnosis not present

## 2020-09-20 DIAGNOSIS — R42 Dizziness and giddiness: Secondary | ICD-10-CM | POA: Insufficient documentation

## 2020-09-20 DIAGNOSIS — H6693 Otitis media, unspecified, bilateral: Secondary | ICD-10-CM | POA: Diagnosis not present

## 2020-09-20 DIAGNOSIS — Z87891 Personal history of nicotine dependence: Secondary | ICD-10-CM | POA: Insufficient documentation

## 2020-09-20 DIAGNOSIS — M7918 Myalgia, other site: Secondary | ICD-10-CM | POA: Insufficient documentation

## 2020-09-20 DIAGNOSIS — I10 Essential (primary) hypertension: Secondary | ICD-10-CM | POA: Insufficient documentation

## 2020-09-20 DIAGNOSIS — Z20822 Contact with and (suspected) exposure to covid-19: Secondary | ICD-10-CM | POA: Insufficient documentation

## 2020-09-20 DIAGNOSIS — B349 Viral infection, unspecified: Secondary | ICD-10-CM

## 2020-09-20 DIAGNOSIS — Z79899 Other long term (current) drug therapy: Secondary | ICD-10-CM | POA: Diagnosis not present

## 2020-09-20 DIAGNOSIS — H6691 Otitis media, unspecified, right ear: Secondary | ICD-10-CM

## 2020-09-20 DIAGNOSIS — Z96642 Presence of left artificial hip joint: Secondary | ICD-10-CM | POA: Diagnosis not present

## 2020-09-20 DIAGNOSIS — R7989 Other specified abnormal findings of blood chemistry: Secondary | ICD-10-CM

## 2020-09-20 LAB — CBC WITH DIFFERENTIAL/PLATELET
Abs Immature Granulocytes: 0.02 10*3/uL (ref 0.00–0.07)
Basophils Absolute: 0.1 10*3/uL (ref 0.0–0.1)
Basophils Relative: 1 %
Eosinophils Absolute: 0.3 10*3/uL (ref 0.0–0.5)
Eosinophils Relative: 4 %
HCT: 38.6 % (ref 36.0–46.0)
Hemoglobin: 13.7 g/dL (ref 12.0–15.0)
Immature Granulocytes: 0 %
Lymphocytes Relative: 38 %
Lymphs Abs: 2.8 10*3/uL (ref 0.7–4.0)
MCH: 33 pg (ref 26.0–34.0)
MCHC: 35.5 g/dL (ref 30.0–36.0)
MCV: 93 fL (ref 80.0–100.0)
Monocytes Absolute: 0.9 10*3/uL (ref 0.1–1.0)
Monocytes Relative: 12 %
Neutro Abs: 3.3 10*3/uL (ref 1.7–7.7)
Neutrophils Relative %: 45 %
Platelets: 259 10*3/uL (ref 150–400)
RBC: 4.15 MIL/uL (ref 3.87–5.11)
RDW: 12 % (ref 11.5–15.5)
WBC: 7.3 10*3/uL (ref 4.0–10.5)
nRBC: 0 % (ref 0.0–0.2)

## 2020-09-20 LAB — COMPREHENSIVE METABOLIC PANEL
ALT: 90 U/L — ABNORMAL HIGH (ref 0–44)
AST: 60 U/L — ABNORMAL HIGH (ref 15–41)
Albumin: 3.8 g/dL (ref 3.5–5.0)
Alkaline Phosphatase: 36 U/L — ABNORMAL LOW (ref 38–126)
Anion gap: 5 (ref 5–15)
BUN: 13 mg/dL (ref 6–20)
CO2: 27 mmol/L (ref 22–32)
Calcium: 8.4 mg/dL — ABNORMAL LOW (ref 8.9–10.3)
Chloride: 104 mmol/L (ref 98–111)
Creatinine, Ser: 0.62 mg/dL (ref 0.44–1.00)
GFR, Estimated: >60 mL/min (ref >60)
Glucose, Bld: 74 mg/dL (ref 70–99)
Potassium: 3.8 mmol/L (ref 3.5–5.1)
Sodium: 136 mmol/L (ref 135–145)
Total Bilirubin: 0.3 mg/dL (ref 0.3–1.2)
Total Protein: 6.7 g/dL (ref 6.5–8.1)

## 2020-09-20 LAB — URINALYSIS, ROUTINE W REFLEX MICROSCOPIC
Bilirubin Urine: NEGATIVE
Glucose, UA: NEGATIVE mg/dL
Hgb urine dipstick: NEGATIVE
Ketones, ur: NEGATIVE mg/dL
Leukocytes,Ua: NEGATIVE
Nitrite: NEGATIVE
Protein, ur: NEGATIVE mg/dL
Specific Gravity, Urine: 1.015 (ref 1.005–1.030)
pH: 7 (ref 5.0–8.0)

## 2020-09-20 LAB — RESP PANEL BY RT-PCR (FLU A&B, COVID) ARPGX2
Influenza A by PCR: NEGATIVE
Influenza B by PCR: NEGATIVE
SARS Coronavirus 2 by RT PCR: NEGATIVE

## 2020-09-20 LAB — PREGNANCY, URINE: Preg Test, Ur: NEGATIVE

## 2020-09-20 MED ORDER — METOCLOPRAMIDE HCL 5 MG/ML IJ SOLN
10.0000 mg | Freq: Once | INTRAMUSCULAR | Status: AC
  Administered 2020-09-20: 10 mg via INTRAVENOUS
  Filled 2020-09-20: qty 2

## 2020-09-20 MED ORDER — DIPHENHYDRAMINE HCL 50 MG/ML IJ SOLN
25.0000 mg | Freq: Once | INTRAMUSCULAR | Status: AC
  Administered 2020-09-20: 25 mg via INTRAVENOUS
  Filled 2020-09-20: qty 1

## 2020-09-20 MED ORDER — ACETAMINOPHEN 325 MG PO TABS
650.0000 mg | ORAL_TABLET | Freq: Once | ORAL | Status: AC
  Administered 2020-09-20: 650 mg via ORAL
  Filled 2020-09-20: qty 2

## 2020-09-20 MED ORDER — SODIUM CHLORIDE 0.9 % IV BOLUS
1000.0000 mL | Freq: Once | INTRAVENOUS | Status: AC
  Administered 2020-09-20: 1000 mL via INTRAVENOUS

## 2020-09-20 MED ORDER — CLINDAMYCIN HCL 150 MG PO CAPS
300.0000 mg | ORAL_CAPSULE | Freq: Once | ORAL | Status: AC
  Administered 2020-09-20: 300 mg via ORAL
  Filled 2020-09-20: qty 2

## 2020-09-20 MED ORDER — CLINDAMYCIN HCL 300 MG PO CAPS: 300.0000 mg | ORAL_CAPSULE | Freq: Three times a day (TID) | ORAL | 0 refills | Status: AC

## 2020-09-20 NOTE — ED Triage Notes (Signed)
Nausea, headache, back pain for a few days. Ringing in her ears.

## 2020-09-20 NOTE — Discharge Instructions (Addendum)
Take clindamycin three times daily for a week for ear infection   See your doctor   Recheck liver function in a week   Return to ER if you have worse headache, fever, vomiting

## 2020-09-20 NOTE — ED Provider Notes (Signed)
[facility] EMERGENCY DEPARTMENT Provider Note   CSN: [id number] Arrival date & time: 09/20/20        History Chief Complaint  Patient presents with   Generalized Body Aches    Kayla Cooper is a 50 y.o. female hx of hypertension, here presenting with body aches and dizziness and ringing in her ears.  Patient states that for the last several days, she has been having some diffuse headache and nausea.  Patient also has been diffuse muscle aches.  Patient has no neck pain or actual fever.  Patient has no urinary symptoms.  Patient does have some lower back pain as well.  The history is provided by the patient.      Past Medical History:  Diagnosis Date   Carpal tunnel syndrome    Cervical polyp    Chronic leg pain    Chronic lower back pain    Hepatitis C    Hypertension    Sciatica     Patient Active Problem List   Diagnosis Date Noted   Essential (primary) hypertension 06/19/2014   Nondisplaced fracture of fifth left metatarsal bone with routine healing 02/14/2014   Gonalgia 01/21/2012   Back pain, chronic 01/05/2012   Pain in left buttock 09/15/2011   Right arm pain 09/15/2011   Hepatitis C 09/15/2011    Past Surgical History:  Procedure Laterality Date   BREAST ENHANCEMENT SURGERY     CARPAL TUNNEL RELEASE     HERNIA REPAIR     TOTAL HIP ARTHROPLASTY Left 09/2015   TUBAL LIGATION       OB History   No obstetric history on file.     Family History  Problem Relation Age of Onset   Heart disease Mother    COPD Mother    Cancer Father     Social History   Tobacco Use   Smoking status: Former    Packs/day: 0.00    Types: Cigarettes   Smokeless tobacco: Never  Vaping Use   Vaping Use: Never used  Substance Use Topics   Alcohol use: No    Alcohol/week: 0.0 standard drinks   Drug use: No    Home Medications Prior to Admission medications   Medication Sig Start Date End Date Taking? Authorizing Provider  amLODipine (NORVASC) 10 MG  tablet Take 1 tablet by mouth daily. 12/26/18  Yes [provider]  amLODipine (NORVASC) 5 MG tablet  06/19/14  Yes [provider]  busPIRone (BUSPAR) 10 MG tablet  06/21/14   [provider]  cloNIDine (CATAPRES - DOSED IN MG/24 HR) 0.1 mg/24hr patch APPLY ONE PATCH WEEKLY 09/21/14   [provider]  diclofenac (VOLTAREN) 75 MG EC tablet Take 1 tablet (75 mg total) by mouth 2 (two) times daily. 07/09/14   Lenda Kelp, MD  fluticasone Aleda Grana) 50 MCG/ACT nasal spray  06/19/14   [provider]  gabapentin (NEURONTIN) 300 MG capsule  01/22/14   [provider]  ibuprofen (ADVIL,MOTRIN) 800 MG tablet Take 1 tablet (800 mg total) by mouth 3 (three) times daily. 07/08/15   Mesner, Barbara Cower, MD  meclizine (ANTIVERT) 25 MG tablet Take 1 tablet (25 mg total) by mouth 3 (three) times daily as needed for dizziness. 01/17/14   Mirian Mo, MD  methocarbamol (ROBAXIN) 500 MG tablet Take 1 tablet (500 mg total) by mouth every 8 (eight) hours as needed for muscle spasms. 07/09/14   Hudnall, Azucena Fallen, MD  oxyCODONE (ROXICODONE) 5 MG immediate release tablet Take  1 tablet (5 mg total) by mouth every 4 (four) hours as needed for severe pain. 07/08/15   Mesner, Barbara Cower, MD  promethazine (PHENERGAN) 25 MG tablet Take by mouth. 12/26/18   [provider]  sucralfate (CARAFATE) 1 GM/10ML suspension Take by mouth. 01/15/20   [provider]  traMADol (ULTRAM) 50 MG tablet Take 1 tablet (50 mg total) by mouth every 6 (six) hours as needed. 04/25/16   Geoffery Lyons, MD    Allergies    Penicillins; Sulfa antibiotics; Darvocet [propoxyphene n-acetaminophen]; Anesthetics, halogenated; and Hydrocodone  Review of Systems   Review of Systems  Constitutional:  Positive for chills.  Neurological:  Positive for headaches.  All other systems reviewed and are negative.  Physical Exam Updated Vital Signs BP 122/70 (BP Location: Left Arm)   Pulse 64   Temp  98.2 F (36.8 C) (Oral)   Resp 18   Ht 5\' 2"  (1.575 m)   Wt 68.9 kg   LMP 09/13/2020 Comment: tubal ligation; patient signed pregnancy waiver  SpO2 100%   BMI 27.80 kg/m   Physical Exam Vitals and nursing note reviewed.  Constitutional:      Appearance: Normal appearance.  HENT:     Head: Normocephalic.     Ears:     Comments: Right TM is bulging and red and left TM has effusion but no erythema    Nose: Nose normal.     Mouth/Throat:     Mouth: Mucous membranes are moist.  Eyes:     Extraocular Movements: Extraocular movements intact.     Pupils: Pupils are equal, round, and reactive to light.  Cardiovascular:     Rate and Rhythm: Normal rate and regular rhythm.     Pulses: Normal pulses.     Heart sounds: Normal heart sounds.  Pulmonary:     Effort: Pulmonary effort is normal.     Breath sounds: Normal breath sounds.  Abdominal:     General: Abdomen is flat.     Palpations: Abdomen is soft.  Musculoskeletal:        General: Normal range of motion.     Cervical back: Normal range of motion and neck supple.     Comments: Paralumbar tenderness and no midline tenderness  Skin:    General: Skin is warm.     Capillary Refill: Capillary refill takes less than 2 seconds.  Neurological:     General: No focal deficit present.     Mental Status: She is alert and oriented to person, place, and time.  Psychiatric:        Mood and Affect: Mood normal.        Behavior: Behavior normal.    ED Results / Procedures / Treatments   Labs (all labs ordered are listed, but only abnormal results are displayed) Labs Reviewed  COMPREHENSIVE METABOLIC PANEL - Abnormal; Notable for the following components:      Result Value   Calcium 8.4 (*)    AST 60 (*)    ALT 90 (*)    Alkaline Phosphatase 36 (*)    All other components within normal limits  RESP PANEL BY RT-PCR (FLU A&B, COVID) ARPGX2  CBC WITH DIFFERENTIAL/PLATELET  URINALYSIS, ROUTINE W REFLEX MICROSCOPIC  PREGNANCY, URINE     EKG None  Radiology DG Chest 2 View  Result Date: 09/20/2020 CLINICAL DATA:  Nausea, headache, back pain for several days EXAM: CHEST - 2 VIEW COMPARISON:  01/17/2014 FINDINGS: Frontal and lateral views of the chest demonstrate an  unremarkable cardiac silhouette. No acute airspace disease, effusion, or pneumothorax. No acute bony abnormalities. IMPRESSION: 1. No acute intrathoracic process. Electronically Signed   By: Sharlet SalinaMichael  Brown M.D.   On: 09/20/2020 19:44   DG Lumbar Spine Complete  Result Date: 09/20/2020 CLINICAL DATA:  Fever and back pain. Nausea, headache, back pain for a few days. EXAM: LUMBAR SPINE - COMPLETE 4+ VIEW COMPARISON:  04/25/2016 FINDINGS: Five lumbar type vertebral bodies. Normal alignment. No vertebral compression deformities. No focal bone lesions or bone destruction. Visualized sacrum appears intact. Normal alignment of the facet joints. Postoperative change in the left hip. IMPRESSION: Mild degenerative changes.  Normal alignment. Electronically Signed   By: Burman NievesWilliam  Stevens M.D.   On: 09/20/2020 19:44   CT HEAD WO CONTRAST (5MM)  Result Date: 09/20/2020 CLINICAL DATA:  Nausea, headache, back pain for a few days. Ringing in her ears. EXAM: CT HEAD WITHOUT CONTRAST CT CERVICAL SPINE WITHOUT CONTRAST TECHNIQUE: Multidetector CT imaging of the head and cervical spine was performed following the standard protocol without intravenous contrast. Multiplanar CT image reconstructions of the cervical spine were also generated. COMPARISON:  None. FINDINGS: CT HEAD FINDINGS Brain: No evidence of large-territorial acute infarction. No parenchymal hemorrhage. No mass lesion. No extra-axial collection. No mass effect or midline shift. No hydrocephalus. Basilar cisterns are patent. Vascular: No hyperdense vessel. Skull: No acute fracture or focal lesion. Sinuses/Orbits: Paranasal sinuses and mastoid air cells are clear. The orbits are unremarkable. Other: None. CT CERVICAL SPINE  FINDINGS Alignment: Reversal of the normal cervical lordosis centered at the C6-C7 level likely due to degenerative changes. Skull base and vertebrae: Multilevel degenerative changes. No acute fracture. No aggressive appearing focal osseous lesion or focal pathologic process. Soft tissues and spinal canal: No prevertebral fluid or swelling. No visible canal hematoma. Upper chest: Unremarkable. Other: None. IMPRESSION: 1. No acute intracranial abnormality. 2. No acute displaced fracture or traumatic listhesis of the cervical spine. Electronically Signed   By: Tish FredericksonMorgane  Naveau M.D.   On: 09/20/2020 19:50   CT Cervical Spine Wo Contrast  Result Date: 09/20/2020 CLINICAL DATA:  Nausea, headache, back pain for a few days. Ringing in her ears. EXAM: CT HEAD WITHOUT CONTRAST CT CERVICAL SPINE WITHOUT CONTRAST TECHNIQUE: Multidetector CT imaging of the head and cervical spine was performed following the standard protocol without intravenous contrast. Multiplanar CT image reconstructions of the cervical spine were also generated. COMPARISON:  None. FINDINGS: CT HEAD FINDINGS Brain: No evidence of large-territorial acute infarction. No parenchymal hemorrhage. No mass lesion. No extra-axial collection. No mass effect or midline shift. No hydrocephalus. Basilar cisterns are patent. Vascular: No hyperdense vessel. Skull: No acute fracture or focal lesion. Sinuses/Orbits: Paranasal sinuses and mastoid air cells are clear. The orbits are unremarkable. Other: None. CT CERVICAL SPINE FINDINGS Alignment: Reversal of the normal cervical lordosis centered at the C6-C7 level likely due to degenerative changes. Skull base and vertebrae: Multilevel degenerative changes. No acute fracture. No aggressive appearing focal osseous lesion or focal pathologic process. Soft tissues and spinal canal: No prevertebral fluid or swelling. No visible canal hematoma. Upper chest: Unremarkable. Other: None. IMPRESSION: 1. No acute intracranial  abnormality. 2. No acute displaced fracture or traumatic listhesis of the cervical spine. Electronically Signed   By: Tish FredericksonMorgane  Naveau M.D.   On: 09/20/2020 19:50    Procedures Procedures   Medications Ordered in ED Medications  sodium chloride 0.9 % bolus 1,000 mL (1,000 mLs Intravenous New Bag/Given 09/20/20 1904)  acetaminophen (TYLENOL) tablet 650 mg (650 mg  Oral Given 09/20/20 1903)  clindamycin (CLEOCIN) capsule 300 mg (300 mg Oral Given 09/20/20 1902)  metoCLOPramide (REGLAN) injection 10 mg (10 mg Intravenous Given 09/20/20 1904)  diphenhydrAMINE (BENADRYL) injection 25 mg (25 mg Intravenous Given 09/20/20 1903)    ED Course  I have reviewed the triage vital signs and the nursing notes.  Pertinent labs & imaging results that were available during my care of the patient were reviewed by me and considered in my medical decision making (see chart for details).    MDM Rules/Calculators/A&P                          Kayla Cooper is a 50 y.o. female here presenting with nausea and headache and back pain.  I think likely viral syndrome.  No meningeal signs.  Lungs are clear.  Consider pneumonia versus UTI versus COVID.  Will get labs and UA and COVID test and chest x-ray.  Will hydrate and reassess  8:25 PM ' White blood cell count is normal.  Patient's LFTs are slightly elevated.  Patient denies drinking alcohol or taking too much Tylenol.  Patient states that her LFTs have always been elevated.  I offered monotest but she states that she had multiple monotest previously and that was negative and does not want that right now.  Stable for discharge.   Final Clinical Impression(s) / ED Diagnoses Final diagnoses:  None    Rx / DC Orders ED Discharge Orders     None        Charlynne Pander, MD 09/20/20 2026

## 2022-05-29 IMAGING — DX DG LUMBAR SPINE COMPLETE 4+V
5 series · 5 of 5 positions shown · non-contrast
Comparison: 04/25/2016

CLINICAL DATA: Fever and back pain. Nausea, headache, back pain for
a few days.

EXAM:
LUMBAR SPINE - COMPLETE 4+ VIEW

[l-spine ap]
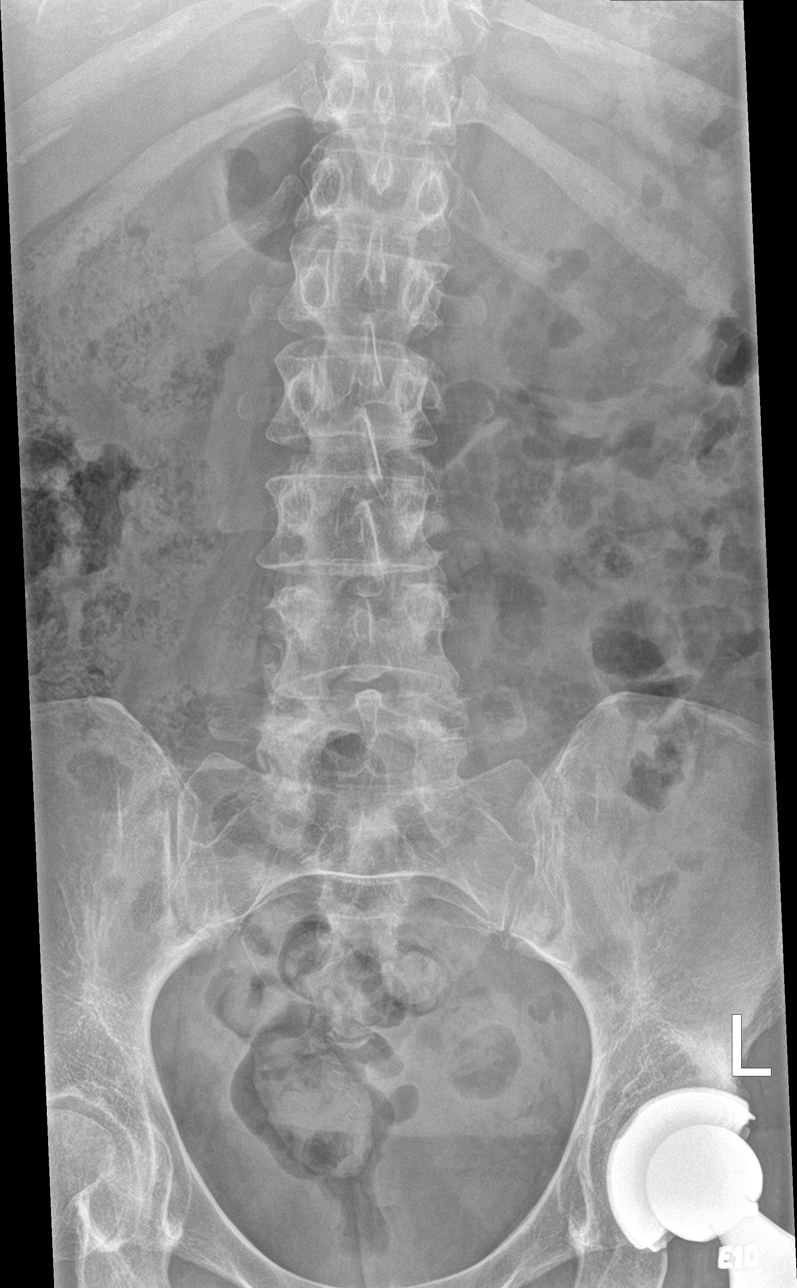

[l-spine obl (1 of 2)]
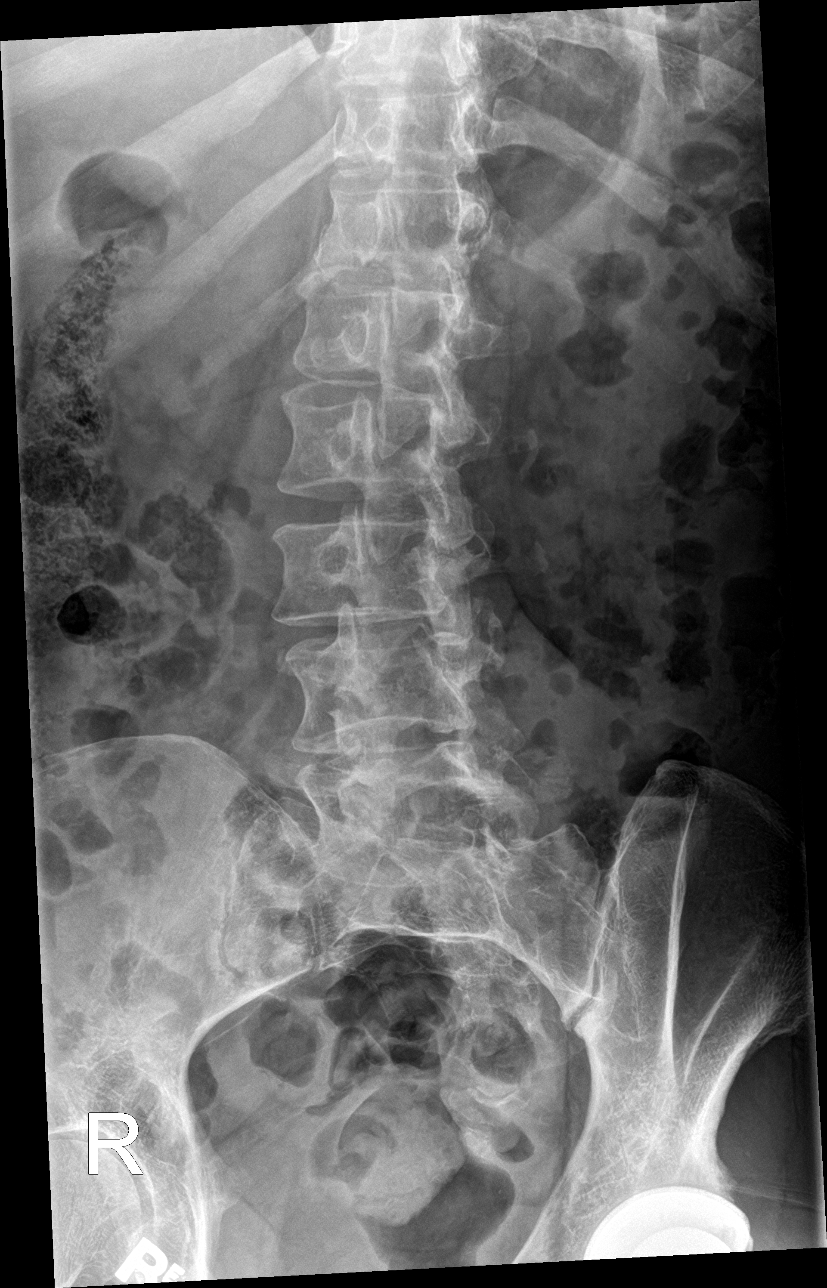

[l-spine obl (2 of 2)]
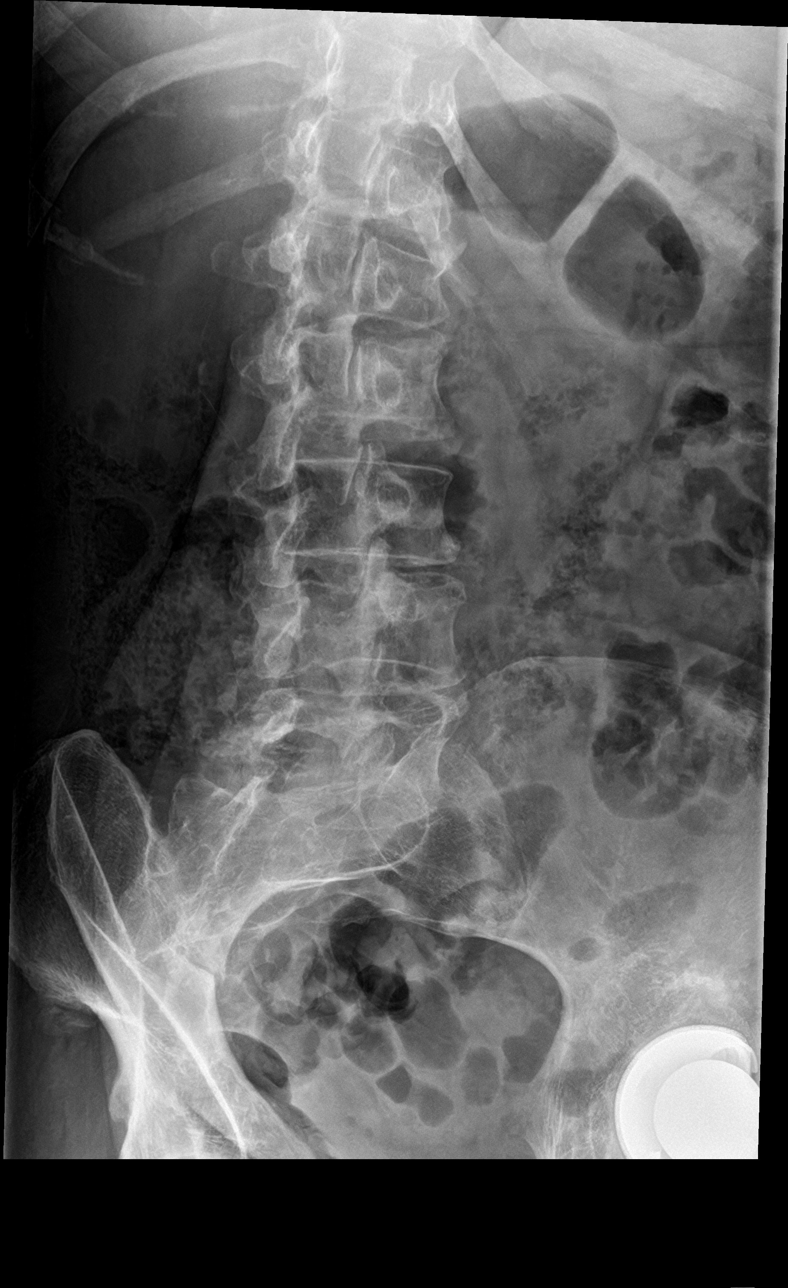

[l-spine lat]
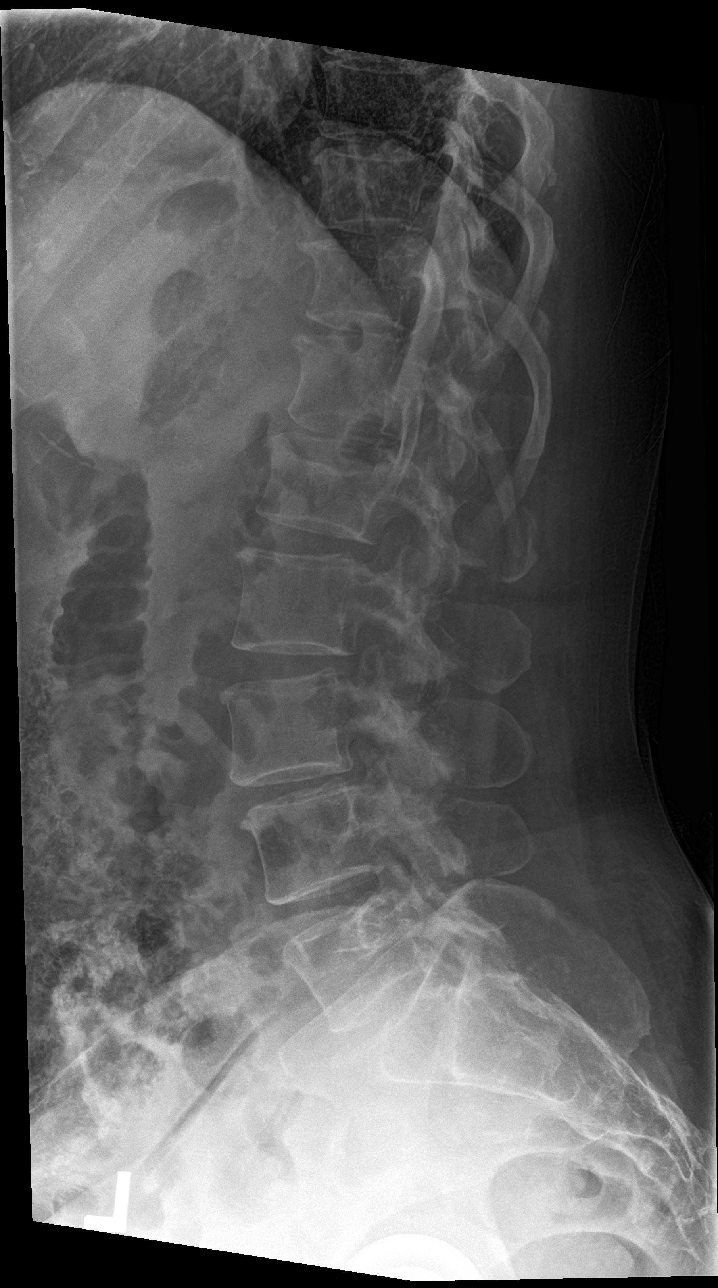

[l-spine spot]
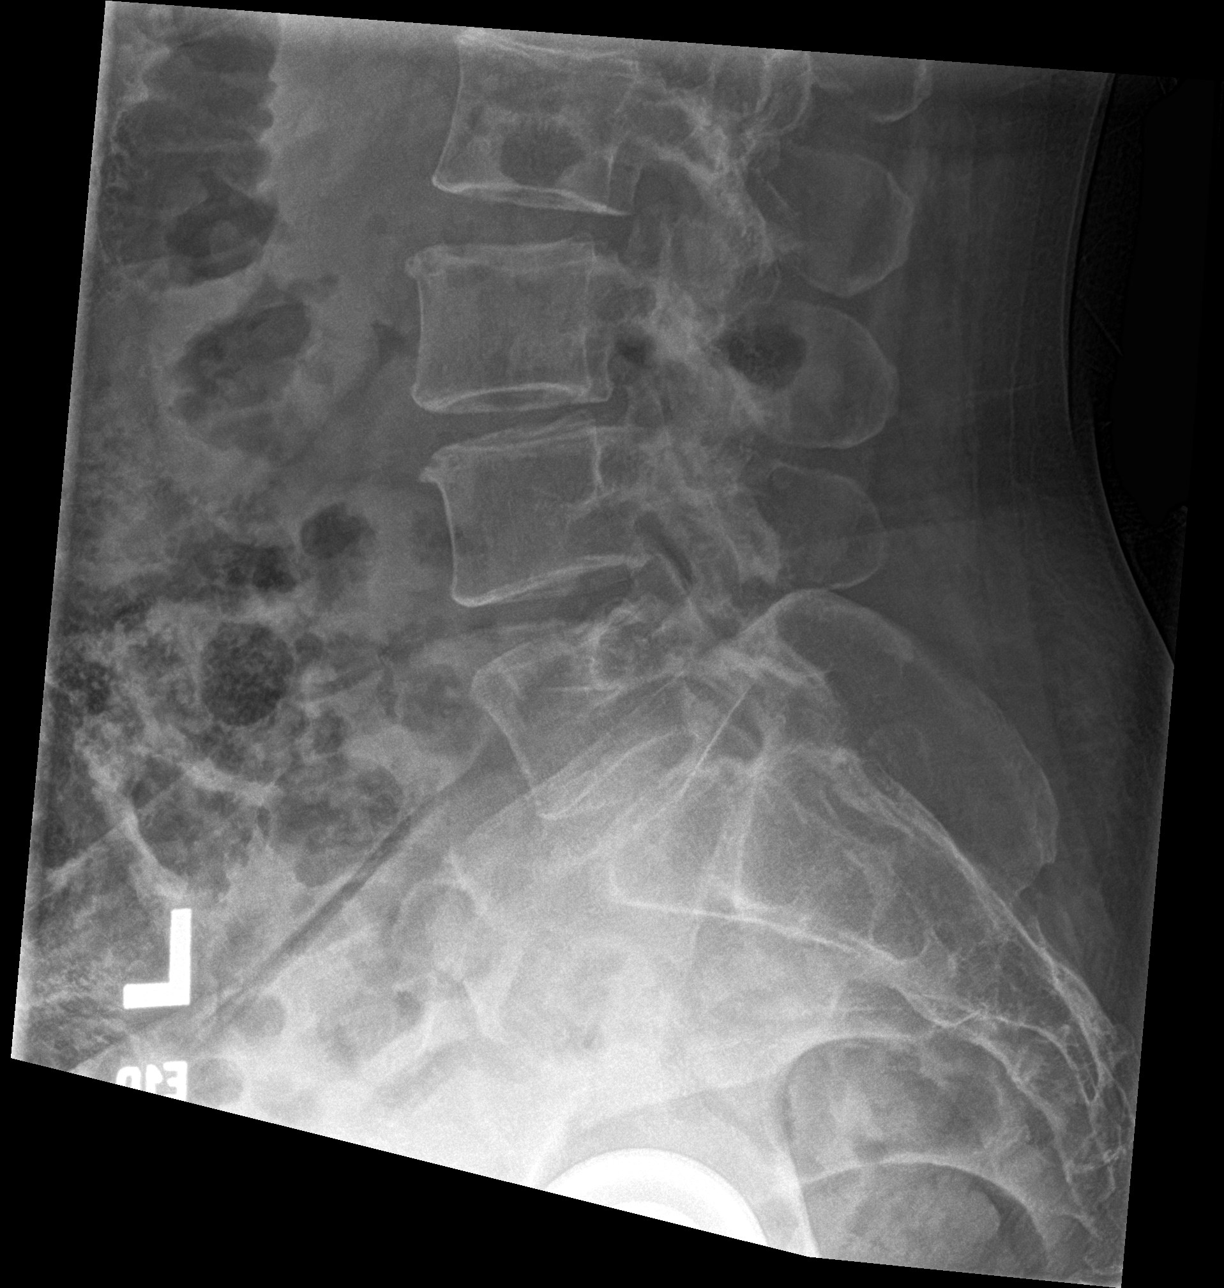

[5 of 5 positions shown; findings below may reference images not displayed]

FINDINGS: Five lumbar type vertebral bodies. Normal alignment. No vertebral
compression deformities. No focal bone lesions or bone destruction.
Visualized sacrum appears intact. Normal alignment of the facet
joints. Postoperative change in the left hip.
IMPRESSION: Mild degenerative changes.  Normal alignment.
